# Patient Record
Sex: Female | Born: 1975 | Race: Black or African American | Hispanic: No | Marital: Single | Smoking: Current every day smoker
Health system: Southern US, Community
[De-identification: ages and names within clinical notes are randomized; demographics above are authoritative.]

## PROBLEM LIST (undated history)

## (undated) HISTORY — PX: WRIST SURGERY: SHX841

## (undated) HISTORY — PX: TUBAL LIGATION: SHX77

---

## 2003-02-28 ENCOUNTER — Emergency Department (HOSPITAL_COMMUNITY): Admission: EM | Admit: 2003-02-28 | Discharge: 2003-03-01 | Payer: Self-pay | Admitting: Emergency Medicine

## 2003-02-28 ENCOUNTER — Emergency Department (HOSPITAL_COMMUNITY): Admission: EM | Admit: 2003-02-28 | Discharge: 2003-02-28 | Payer: Self-pay | Admitting: Emergency Medicine

## 2003-03-09 ENCOUNTER — Emergency Department (HOSPITAL_COMMUNITY): Admission: EM | Admit: 2003-03-09 | Discharge: 2003-03-09 | Payer: Self-pay | Admitting: Internal Medicine

## 2004-01-14 ENCOUNTER — Emergency Department (HOSPITAL_COMMUNITY): Admission: EM | Admit: 2004-01-14 | Discharge: 2004-01-14 | Payer: Self-pay | Admitting: Emergency Medicine

## 2004-02-08 ENCOUNTER — Encounter: Admission: RE | Admit: 2004-02-08 | Discharge: 2004-02-28 | Payer: Self-pay | Admitting: Specialist

## 2013-08-10 ENCOUNTER — Encounter (HOSPITAL_COMMUNITY): Payer: Self-pay

## 2013-08-10 ENCOUNTER — Emergency Department (HOSPITAL_COMMUNITY)
Admission: EM | Admit: 2013-08-10 | Discharge: 2013-08-10 | Disposition: A | Discharge status: Home / Self Care | Payer: Medicare FFS | Attending: Emergency Medicine | Admitting: Emergency Medicine

## 2013-08-10 ENCOUNTER — Emergency Department (HOSPITAL_COMMUNITY): Payer: Medicare FFS

## 2013-08-10 DIAGNOSIS — J069 Acute upper respiratory infection, unspecified: Secondary | ICD-10-CM | POA: Insufficient documentation

## 2013-08-10 DIAGNOSIS — F17200 Nicotine dependence, unspecified, uncomplicated: Secondary | ICD-10-CM | POA: Insufficient documentation

## 2013-08-10 MED ORDER — BENZONATATE 100 MG PO CAPS
200.0000 mg | ORAL_CAPSULE | Freq: Once | ORAL | Status: AC
  Administered 2013-08-10: 200 mg via ORAL
  Filled 2013-08-10: qty 2

## 2013-08-10 MED ORDER — PROMETHAZINE-CODEINE 6.25-10 MG/5ML PO SYRP: 5.0000 mL | ORAL_SOLUTION | ORAL | Status: DC | PRN

## 2013-08-10 MED ORDER — BENZONATATE 200 MG PO CAPS: 200.0000 mg | ORAL_CAPSULE | Freq: Three times a day (TID) | ORAL | Status: DC

## 2013-08-10 NOTE — ED Notes (Signed)
Pt c/o nonproductive cough, chest congestion, and runny nose since Sunday.  Reports has been taking mucinex to help make cough productive.  Pt says has coughed so much her chest hurts with coughing.    Denies fever.

## 2013-08-10 NOTE — ED Notes (Signed)
Sick since Sunday with cough, sinus congestion and hurts in chest when coughs.

## 2013-08-12 NOTE — ED Provider Notes (Signed)
CSN: [id number]     Arrival date & time 08/10/13  1742 History   First MD Initiated Contact with Patient 08/10/13 1909     Chief Complaint  Patient presents with  . URI   (Consider location/radiation/quality/duration/timing/severity/associated sxs/prior Treatment) HPI Comments: Brenda Avila is a 37 y.o. female    presenting with a 2 day history of uri type symptoms which includes nasal congestion with clear rhinorrhea, sinus pressure and nonproductive cough with burning chest pain which is triggered by coughing.  Symptoms due to not include shortness of breath, hemoptysis, bloody nasal secretions, headache, ear pain, drainage or dizziness.  She denies nausea, vomiting or diarrhea.  The patient has taken mucinex prior to arrival with no significant improvement in symptoms.   The history is provided by the patient.    History reviewed. No pertinent past medical history. History reviewed. No pertinent past surgical history. No family history on file. History  Substance Use Topics  . Smoking status: Current Every Day Smoker  . Smokeless tobacco: Not on file  . Alcohol Use: No   OB History   Grav Para Term Preterm Abortions TAB SAB Ect Mult Living                 Review of Systems  Constitutional: Negative for fever and chills.  HENT: Positive for congestion, rhinorrhea and sinus pressure. Negative for sore throat and neck pain.   Eyes: Negative.   Respiratory: Positive for cough. Negative for chest tightness, shortness of breath, wheezing and stridor.   Cardiovascular: Negative for chest pain.  Gastrointestinal: Negative for nausea and abdominal pain.  Genitourinary: Negative.   Musculoskeletal: Negative for joint swelling and arthralgias.  Skin: Negative.  Negative for rash and wound.  Neurological: Negative for dizziness, weakness, light-headedness, numbness and headaches.  Psychiatric/Behavioral: Negative.     Allergies  Review of patient's allergies indicates no known  allergies.  Home Medications   Current Outpatient Rx  Name  Route  Sig  Dispense  Refill  . benzonatate (TESSALON) 200 MG capsule   Oral   Take 1 capsule (200 mg total) by mouth every 8 (eight) hours.   21 capsule   0   . promethazine-codeine (PHENERGAN WITH CODEINE) 6.25-10 MG/5ML syrup   Oral   Take 5 mLs by mouth every 4 (four) hours as needed for cough.   120 mL   0    BP 131/80  Pulse 92  Temp(Src) 99.8 F (37.7 C) (Oral)  Resp 18  Ht 5\' 8"  (1.727 m)  Wt 225 lb (102.059 kg)  BMI 34.22 kg/m2  SpO2 99%  LMP 08/10/2013 Physical Exam  Constitutional: She is oriented to person, place, and time. She appears well-developed and well-nourished.  HENT:  Head: Normocephalic and atraumatic.  Right Ear: Tympanic membrane, external ear and ear canal normal.  Left Ear: Tympanic membrane, external ear and ear canal normal.  Nose: Mucosal edema and rhinorrhea present.  Mouth/Throat: Uvula is midline, oropharynx is clear and moist and mucous membranes are normal. No oropharyngeal exudate, posterior oropharyngeal edema, posterior oropharyngeal erythema or tonsillar abscesses.  Sinus's nontender to palpation  Eyes: Conjunctivae are normal.  Cardiovascular: Normal rate and normal heart sounds.   Pulmonary/Chest: Effort normal. No respiratory distress. She has no decreased breath sounds. She has no wheezes. She has no rhonchi. She has no rales.  Abdominal: Soft. There is no tenderness.  Musculoskeletal: Normal range of motion.  Neurological: She is alert and oriented to person, place, and time.  Skin: Skin is warm and dry. No rash noted.  Psychiatric: She has a normal mood and affect.    ED Course  Procedures (including critical care time) Labs Review Labs Reviewed - No data to display Imaging Review Dg Chest 2 View  08/10/2013   *RADIOLOGY REPORT*  Clinical Data: Upper respiratory infection  CHEST - 2 VIEW  Comparison: None  Findings: The lungs are well-aerated and free from  pulmonary edema, focal airspace consolidation or pulmonary nodule.  Cardiac and mediastinal contours are within normal limits.  No pneumothorax, or pleural effusion. No acute osseous findings.  IMPRESSION:  No acute cardiopulmonary disease.   Original Report Authenticated By: Malachy Moan, M.D.    MDM   1. URI, acute    Encouraged to continue mucinex, increase fluid intake, motrin for chest pain relief,  Fever reduction (low grade here).  Prescribed tessalon for cough during day/working hours, phenergan with codeine for night time cough relief..  Encouraged rest,  Recheck for worsened sx.  Reviewed and discussed chest xray. No pulmonary bacterial infection at this time.  The patient appears reasonably screened and/or stabilized for discharge and I doubt any other medical condition or other [facility] requiring further screening, evaluation, or treatment in the ED at this time prior to discharge.     Burgess Amor, PA-C 08/12/13 1239

## 2013-08-13 NOTE — ED Provider Notes (Signed)
Medical screening examination/treatment/procedure(s) were performed by non-physician practitioner and as supervising physician I was immediately available for consultation/collaboration.   Dariyon Urquilla W. Kenzy Campoverde, MD 08/13/13 0841 

## 2016-05-17 ENCOUNTER — Emergency Department (HOSPITAL_COMMUNITY)
Admission: EM | Admit: 2016-05-17 | Discharge: 2016-05-18 | Disposition: A | Discharge status: Home / Self Care | Payer: Medicaid Other | Attending: Emergency Medicine | Admitting: Emergency Medicine

## 2016-05-17 ENCOUNTER — Encounter (HOSPITAL_COMMUNITY): Payer: Self-pay

## 2016-05-17 DIAGNOSIS — M79673 Pain in unspecified foot: Secondary | ICD-10-CM

## 2016-05-17 DIAGNOSIS — M79672 Pain in left foot: Secondary | ICD-10-CM | POA: Diagnosis not present

## 2016-05-17 DIAGNOSIS — F17200 Nicotine dependence, unspecified, uncomplicated: Secondary | ICD-10-CM | POA: Diagnosis not present

## 2016-05-17 NOTE — ED Notes (Signed)
Pt states she has had a hard callous on the bottom of her foot that has gotten worse since recently on her feet at her job, using otc meds for same without relief.

## 2016-05-18 ENCOUNTER — Emergency Department (HOSPITAL_COMMUNITY): Payer: Medicaid Other

## 2016-05-18 MED ORDER — IBUPROFEN 600 MG PO TABS: 600.0000 mg | ORAL_TABLET | Freq: Four times a day (QID) | ORAL | Status: DC | PRN

## 2016-05-18 MED ORDER — TRAMADOL HCL 50 MG PO TABS: ORAL_TABLET | ORAL | Status: DC

## 2016-05-18 NOTE — ED Provider Notes (Signed)
CSN: [id number]     Arrival date & time 05/17/16  2335 History   First MD Initiated Contact with Patient 05/18/16 0013     Chief Complaint  Patient presents with  . Foot Pain     (Consider location/radiation/quality/duration/timing/severity/associated sxs/prior Treatment) Patient is a 40 y.o. female presenting with lower extremity pain. The history is provided by the patient.  Foot Pain This is a new problem. The current episode started more than 1 year ago. Progression since onset: worse since Tuesday 6/20. Pertinent negatives include no fever, nausea, numbness or vomiting. The symptoms are aggravated by walking and standing. Treatments tried: OTC corn remover medication. The treatment provided no relief.    History reviewed. No pertinent past medical history. No past surgical history on file. No family history on file. Social History  Substance Use Topics  . Smoking status: Current Every Day Smoker  . Smokeless tobacco: None  . Alcohol Use: No   OB History    No data available     Review of Systems  Constitutional: Negative for fever.  Gastrointestinal: Negative for nausea and vomiting.  Musculoskeletal:       Foot pain  Neurological: Negative for numbness.  All other systems reviewed and are negative.     Allergies  Hydrocodone  Home Medications   Prior to Admission medications   Medication Sig Start Date End Date Taking? Authorizing Provider  benzonatate (TESSALON) 200 MG capsule Take 1 capsule (200 mg total) by mouth every 8 (eight) hours. 08/10/13   Burgess AmorJulie Idol, PA-C  promethazine-codeine (PHENERGAN WITH CODEINE) 6.25-10 MG/5ML syrup Take 5 mLs by mouth every 4 (four) hours as needed for cough. 08/10/13   Burgess AmorJulie Idol, PA-C   BP 133/94 mmHg  Pulse 84  Temp(Src) 98.2 F (36.8 C)  Resp 16  Ht 5\' 9"  (1.753 m)  Wt 94.348 kg  BMI 30.70 kg/m2  SpO2 93%  LMP 05/13/2016 Physical Exam  Constitutional: She is oriented to person, place, and time. She appears  well-developed and well-nourished.  Non-toxic appearance.  HENT:  Head: Normocephalic.  Right Ear: Tympanic membrane and external ear normal.  Left Ear: Tympanic membrane and external ear normal.  Eyes: EOM and lids are normal. Pupils are equal, round, and reactive to light.  Neck: Normal range of motion. Neck supple. Carotid bruit is not present.  Cardiovascular: Normal rate, regular rhythm, normal heart sounds, intact distal pulses and normal pulses.   Pulmonary/Chest: Breath sounds normal. No respiratory distress.  Abdominal: Soft. Bowel sounds are normal. There is no tenderness. There is no guarding.  Musculoskeletal: Normal range of motion.  Lymphadenopathy:       Head (right side): No submandibular adenopathy present.       Head (left side): No submandibular adenopathy present.    She has no cervical adenopathy.  Neurological: She is alert and oriented to person, place, and time. She has normal strength. No cranial nerve deficit or sensory deficit.  Skin: Skin is warm and dry.  Psychiatric: She has a normal mood and affect. Her speech is normal.  Nursing note and vitals reviewed.   ED Course  Procedures (including critical care time) Labs Review Labs Reviewed - No data to display  Imaging Review No results found. I have personally reviewed and evaluated these images and lab results as part of my medical decision-making.   EKG Interpretation None      MDM  Vital signs stable. Xray is negative for f/b or fracture.  Pt referred to podiatry. Pt  will use warm tub soaks and Vaseline to the affected area. Rx for ultram and ibuprofen given to the patient. Pt in agreement with plan.   Final diagnoses:  Foot pain, unspecified laterality    *I have reviewed nursing notes, vital signs, and all appropriate lab and imaging results for this patient.**    Ivery QualeHobson Anjannette Gauger, PA-C 05/18/16 1836  Ivery QualeHobson Edmundo Tedesco, PA-C 05/23/16 [id number]  Devoria AlbeIva Knapp, MD 05/23/16 2259

## 2016-05-18 NOTE — Discharge Instructions (Signed)
Soak your foot in warm epson salt water daily. See Dr Al CorpusHyatt or a member of his team as soon as possible. Use ibuprofen  Every 6 hours for mild pain. Use ultram for more severe pain. This medication may cause drowsiness. Please do not drink, drive, or participate in activity that requires concentration while taking this medication.

## 2016-05-22 ENCOUNTER — Ambulatory Visit (INDEPENDENT_AMBULATORY_CARE_PROVIDER_SITE_OTHER): Payer: Medicaid Other | Admitting: Podiatry

## 2016-05-22 ENCOUNTER — Encounter: Payer: Self-pay | Admitting: Podiatry

## 2016-05-22 VITALS — BP 139/83 | HR 82 | Resp 16

## 2016-05-22 DIAGNOSIS — B078 Other viral warts: Secondary | ICD-10-CM

## 2016-05-22 DIAGNOSIS — B079 Viral wart, unspecified: Secondary | ICD-10-CM

## 2016-05-22 DIAGNOSIS — L988 Other specified disorders of the skin and subcutaneous tissue: Secondary | ICD-10-CM | POA: Diagnosis not present

## 2016-05-22 DIAGNOSIS — L989 Disorder of the skin and subcutaneous tissue, unspecified: Secondary | ICD-10-CM

## 2016-05-22 NOTE — Progress Notes (Addendum)
   Subjective:    Patient ID: Brenda Avila, female    DOB: 10-Jun-1976, 40 y.o.   MRN: [id number]  HPI this patient presents to the office with chief complaint of a painful area on the bottom of her left foot. She says her pain has been present for about a year. She says this started when she started wearing her sandals 1 year ago approximately November of last year. She realized she had a skin lesion on the bottom of her left foot and started treating the skin lesion with acid treatments. She has continued using the acid in the problem continues. She is now having pain and discomfort walking and wearing her shoes. She presents the office today for definitive evaluation and treatment of this condition    Review of Systems  All other systems reviewed and are negative.      Objective:   Physical Exam GENERAL APPEARANCE: Alert, conversant. Appropriately groomed. No acute distress.  VASCULAR: Pedal pulses are  [facility]  [facility].  Capillary refill time is immediate to all digits,  Normal temperature gradient.  Digital hair growth is present bilateral  NEUROLOGIC: sensation is normal to 5.07 monofilament at 5/5 sites bilateral.  Light touch is intact bilateral, Muscle strength normal.  MUSCULOSKELETAL: acceptable muscle strength, tone and stability bilateral.  Intrinsic muscluature intact bilateral.  Rectus appearance of foot and digits noted bilateral.   DERMATOLOGIC: skin color, texture, and turgor are within normal limits.  No preulcerative lesions or ulcers  are seen, no interdigital maceration noted.  No open lesions present.   No drainage noted. Clear skin lesion sub 5th metabase left foot.  Significant necrotic tissue noted at this site due to acid treatment         Assessment & Plan:  Benign skin lesion  Verrucae  IE  Debride skin lesion.  Discussed future treatment and possible excision of this lesion.  Discontinue acid usage. RTC prn  Helane GuntherGregory Mayer DPM

## 2016-06-27 ENCOUNTER — Ambulatory Visit (INDEPENDENT_AMBULATORY_CARE_PROVIDER_SITE_OTHER): Payer: Medicaid Other | Admitting: Podiatry

## 2016-06-27 DIAGNOSIS — Q828 Other specified congenital malformations of skin: Secondary | ICD-10-CM

## 2016-06-27 DIAGNOSIS — L989 Disorder of the skin and subcutaneous tissue, unspecified: Secondary | ICD-10-CM

## 2016-06-27 NOTE — Progress Notes (Signed)
This patient presents to the office for the painful skin lesion that has returned on the bottom of her left foot. She states that the pain has become significant since he started working at her new job. She states that her job requires her to stand and walk which causes pain and discomfort through the site. She self treated herself with acid approximately 6 weeks ago and came to the office for treatment. The necrotic tissue from the acid application was to provided and the patient was told to return to the office if the problem returned. She now returns to the office for definitive evaluation and treatment of this condition  GENERAL APPEARANCE: Alert, conversant. Appropriately groomed. No acute distress.  VASCULAR: Pedal pulses are  palpable at  [facility] and PT bilateral.  Capillary refill time is immediate to all digits,  Normal temperature gradient.  Digital hair growth is present bilateral  NEUROLOGIC: sensation is normal to 5.07 monofilament at 5/5 sites bilateral.  Light touch is intact bilateral, Muscle strength normal.  MUSCULOSKELETAL: acceptable muscle strength, tone and stability bilateral.  Intrinsic muscluature intact bilateral.  Rectus appearance of foot and digits noted bilateral.   DERMATOLOGIC: skin color, texture, and turgor are within normal limits.  No preulcerative lesions or ulcers  are seen, no interdigital maceration noted.  No open lesions present.  Digital nails are asymptomatic. No drainage noted. Porokeratotic tissue noted sub 5th metabase.  Benign skin lesion.  Porokeratosis left foot.  ROV  Debride porokeratosis.  Padding dispensed  Discussed future acid and possible surgical curretage.  We discussed postponing definitive treatment since she just started her new job.  To reschedule when her schedule has a few days off.   Helane Gunther DPM

## 2016-10-02 ENCOUNTER — Encounter: Payer: Self-pay | Admitting: Podiatry

## 2016-10-02 ENCOUNTER — Ambulatory Visit (INDEPENDENT_AMBULATORY_CARE_PROVIDER_SITE_OTHER): Payer: Medicaid Other | Admitting: Podiatry

## 2016-10-02 DIAGNOSIS — Q828 Other specified congenital malformations of skin: Secondary | ICD-10-CM

## 2016-10-02 DIAGNOSIS — L989 Disorder of the skin and subcutaneous tissue, unspecified: Secondary | ICD-10-CM

## 2016-10-02 NOTE — Progress Notes (Signed)
This patient presents to the office for the painful skin lesion that has returned on the bottom of her left foot. She states that the pain has become significant since he started working at her new job. She states that her job requires her to stand and walk which causes pain and discomfort through the site. She self treated herself with acid approximately 6 weeks ago and came to the office for treatment. The necrotic tissue from the acid application was to provided and the patient was told to return to the office if the problem returned. She now returns to the office for definitive evaluation and treatment of this condition  GENERAL APPEARANCE: Alert, conversant. Appropriately groomed. No acute distress.  VASCULAR: Pedal pulses are  [facility]pable [facility]  [facility].  Capillary refill time is immediate to all digits,  Normal temperature gradient.  Digital hair growth is present bilateral  NEUROLOGIC: sensation is normal to 5.07 monofilament at 5/5 sites bilateral.  Light touch is intact bilateral, Muscle strength normal.  MUSCULOSKELETAL: acceptable muscle strength, tone and stability bilateral.  Intrinsic muscluature intact bilateral.  Rectus appearance of foot and digits noted bilateral.   DERMATOLOGIC: skin color, texture, and turgor are within normal limits.  No preulcerative lesions or ulcers  are seen, no interdigital maceration noted.  No open lesions present.  Digital nails are asymptomatic. No drainage noted. Porokeratotic tissue noted sub 5th metabase.  Benign skin lesion.  Porokeratosis left foot.  ROV  Debride porokeratosis.  Padding dispensed  Discussed future acid and possible surgical curretage.  We discussed postponing definitive treatment since she just started her new job.  We discussed surgery but she started to sweat and cry and be nervous. Therefore we postponed the surgery until a later date.   Helane GuntherGregory Livingston Antolin DPM

## 2016-10-23 ENCOUNTER — Encounter: Payer: Self-pay | Admitting: Podiatry

## 2016-10-23 ENCOUNTER — Other Ambulatory Visit: Payer: Self-pay | Admitting: Podiatry

## 2016-10-23 ENCOUNTER — Ambulatory Visit (INDEPENDENT_AMBULATORY_CARE_PROVIDER_SITE_OTHER): Payer: Medicaid Other | Admitting: Podiatry

## 2016-10-23 VITALS — BP 158/106 | HR 92 | Resp 18

## 2016-10-23 DIAGNOSIS — L988 Other specified disorders of the skin and subcutaneous tissue: Secondary | ICD-10-CM

## 2016-10-23 DIAGNOSIS — Q828 Other specified congenital malformations of skin: Secondary | ICD-10-CM | POA: Diagnosis not present

## 2016-10-23 DIAGNOSIS — L989 Disorder of the skin and subcutaneous tissue, unspecified: Secondary | ICD-10-CM

## 2016-10-23 NOTE — Progress Notes (Signed)
This patient presents to the office for the painful skin lesion that has returned on the bottom of her left foot. She states that the pain has become significant since he started working at her new job. She states that her job requires her to stand and walk which causes pain and discomfort through the site. She presents to the office for scheduled removal of the skin lesion on the bottom of the left foot  GENERAL APPEARANCE: Alert, conversant. Appropriately groomed. No acute distress.  VASCULAR: Pedal pulses are  palpable at  [facility].  Capillary refill time is immediate to all digits,  Normal temperature gradient.  Digital hair growth is present bilateral  NEUROLOGIC: sensation is normal to 5.07 monofilament at 5/5 sites bilateral.  Light touch is intact bilateral, Muscle strength normal.  MUSCULOSKELETAL: acceptable muscle strength, tone and stability bilateral.  Intrinsic muscluature intact bilateral.  Rectus appearance of foot and digits noted bilateral.   DERMATOLOGIC: skin color, texture, and turgor are within normal limits.  No preulcerative lesions or ulcers  are seen, no interdigital maceration noted.  No open lesions present.  Digital nails are asymptomatic. No drainage noted. Porokeratotic tissue noted sub 5th metabase.  Benign skin lesion.  Porokeratosis left foot.  ROV   Skin surgery.  This patient was anesthetized with mixture of 2% lidocaine plain and 2 % lidocaine with epi. at the site of the skin lesion.  The surgical site was then washed with betadine and alcohol.  Using a punch the lesion was excised and passed off as specimen.  The surgical site was cauterized with phenol and washed with alcohol.  The site was bandaged with neosporin, sterile 2x2 and kling.  Home instructions given.   Helane GuntherGregory Zerick Prevette DPM   Helane GuntherGregory Latunya Kissick DPM

## 2016-10-25 ENCOUNTER — Telehealth: Payer: Self-pay | Admitting: *Deleted

## 2016-10-25 DIAGNOSIS — L989 Disorder of the skin and subcutaneous tissue, unspecified: Secondary | ICD-10-CM

## 2016-10-25 NOTE — Telephone Encounter (Signed)
[facility]23/2016 [facility]

## 2016-10-28 LAB — PATHOLOGY

## 2016-10-30 ENCOUNTER — Encounter: Payer: Self-pay | Admitting: Podiatry

## 2016-10-30 ENCOUNTER — Ambulatory Visit (INDEPENDENT_AMBULATORY_CARE_PROVIDER_SITE_OTHER): Payer: Medicaid Other | Admitting: Podiatry

## 2016-10-30 VITALS — Resp 16 | Ht 69.0 in

## 2016-10-30 DIAGNOSIS — Z09 Encounter for follow-up examination after completed treatment for conditions other than malignant neoplasm: Secondary | ICD-10-CM

## 2016-10-30 NOTE — Progress Notes (Signed)
This patient presents the office follow-up for the removal of the porokeratosis on the plantar aspect of the left foot. She states she is not having much pain or discomfort, but there is a little bleeding from the surgical site. She has been soaking and bandaging her foot as requested. He presents the office today for continued evaluation and treatment of this condition  GENERAL APPEARANCE: Alert, conversant. Appropriately groomed. No acute distress.  VASCULAR: Pedal pulses are  palpable at  [facility]  Capillary refill time is immediate to all digits,  Normal temperature gradient.  Digital hair growth is present bilateral  NEUROLOGIC: sensation is normal to 5.07 monofilament at 5/5 sites bilateral.  Light touch is intact bilateral, Muscle strength normal.  MUSCULOSKELETAL: acceptable muscle strength, tone and stability bilateral.  Intrinsic muscluature intact bilateral.  Rectus appearance of foot and digits noted bilateral.   DERMATOLOGIC: skin color, texture, and turgor are within normal limits.  No preulcerative lesions or ulcers  are seen, no interdigital maceration noted.  No open lesions present.  Digital nails are asymptomatic. No drainage noted. Healing noted site of porokeratosis left foot.  No redness or swelling noted.  S/p foot surgery   ROV  Neosporin/DSD  Continue soaks and bandage throught the week.  RTC prn   Helane GuntherGregory Dalary Hollar DPM

## 2017-02-28 ENCOUNTER — Encounter (HOSPITAL_COMMUNITY): Payer: Self-pay | Admitting: *Deleted

## 2017-02-28 ENCOUNTER — Emergency Department (HOSPITAL_COMMUNITY)
Admission: EM | Admit: 2017-02-28 | Discharge: 2017-02-28 | Disposition: A | Discharge status: Home / Self Care | Payer: Medicaid Other | Attending: Emergency Medicine | Admitting: Emergency Medicine

## 2017-02-28 ENCOUNTER — Emergency Department (HOSPITAL_COMMUNITY): Payer: Medicaid Other

## 2017-02-28 DIAGNOSIS — F17200 Nicotine dependence, unspecified, uncomplicated: Secondary | ICD-10-CM | POA: Insufficient documentation

## 2017-02-28 DIAGNOSIS — R0789 Other chest pain: Secondary | ICD-10-CM | POA: Diagnosis present

## 2017-02-28 DIAGNOSIS — R8299 Other abnormal findings in urine: Secondary | ICD-10-CM | POA: Insufficient documentation

## 2017-02-28 DIAGNOSIS — R079 Chest pain, unspecified: Secondary | ICD-10-CM

## 2017-02-28 DIAGNOSIS — R05 Cough: Secondary | ICD-10-CM | POA: Diagnosis not present

## 2017-02-28 DIAGNOSIS — R0602 Shortness of breath: Secondary | ICD-10-CM | POA: Diagnosis not present

## 2017-02-28 LAB — CBC WITH DIFFERENTIAL/PLATELET
BASOS ABS: 0 10*3/uL (ref 0.0–0.1)
Basophils Relative: 1 %
Eosinophils Absolute: 0.3 10*3/uL (ref 0.0–0.7)
Eosinophils Relative: 4 %
HCT: 39.3 % (ref 36.0–46.0)
Hemoglobin: 13.5 g/dL (ref 12.0–15.0)
LYMPHS PCT: 52 %
Lymphs Abs: 3.5 10*3/uL (ref 0.7–4.0)
MCH: 31.4 pg (ref 26.0–34.0)
MCHC: 34.4 g/dL (ref 30.0–36.0)
MCV: 91.4 fL (ref 78.0–100.0)
Monocytes Absolute: 0.4 10*3/uL (ref 0.1–1.0)
Monocytes Relative: 5 %
NEUTROS ABS: 2.6 10*3/uL (ref 1.7–7.7)
Neutrophils Relative %: 38 %
Platelets: 300 10*3/uL (ref 150–400)
RBC: 4.3 MIL/uL (ref 3.87–5.11)
RDW: 15.3 % (ref 11.5–15.5)
WBC: 6.7 10*3/uL (ref 4.0–10.5)

## 2017-02-28 LAB — URINALYSIS, ROUTINE W REFLEX MICROSCOPIC
Bilirubin Urine: NEGATIVE
Glucose, UA: NEGATIVE mg/dL
KETONES UR: NEGATIVE mg/dL
Nitrite: NEGATIVE
PROTEIN: NEGATIVE mg/dL
Specific Gravity, Urine: 1.024 (ref 1.005–1.030)
pH: 5 (ref 5.0–8.0)

## 2017-02-28 LAB — COMPREHENSIVE METABOLIC PANEL
ALT: 20 U/L (ref 14–54)
AST: 24 U/L (ref 15–41)
Albumin: 3.6 g/dL (ref 3.5–5.0)
Alkaline Phosphatase: 41 U/L (ref 38–126)
Anion gap: 8 (ref 5–15)
BUN: 8 mg/dL (ref 6–20)
CO2: 24 mmol/L (ref 22–32)
Calcium: 8.9 mg/dL (ref 8.9–10.3)
Chloride: 105 mmol/L (ref 101–111)
Creatinine, Ser: 1.08 mg/dL — ABNORMAL HIGH (ref 0.44–1.00)
Glucose, Bld: 95 mg/dL (ref 65–99)
POTASSIUM: 3.9 mmol/L (ref 3.5–5.1)
Sodium: 137 mmol/L (ref 135–145)
Total Bilirubin: 0.3 mg/dL (ref 0.3–1.2)
Total Protein: 7 g/dL (ref 6.5–8.1)

## 2017-02-28 LAB — POC URINE PREG, ED: PREG TEST UR: NEGATIVE

## 2017-02-28 LAB — LIPASE, BLOOD: Lipase: 25 U/L (ref 11–51)

## 2017-02-28 LAB — TROPONIN I: Troponin I: <0.03 ng/mL (ref <0.03)

## 2017-02-28 MED ORDER — PREDNISONE 20 MG PO TABS: ORAL_TABLET | ORAL | 0 refills | Status: DC

## 2017-02-28 MED ORDER — MELOXICAM 7.5 MG PO TABS: 7.5000 mg | ORAL_TABLET | Freq: Every day | ORAL | 0 refills | Status: DC

## 2017-02-28 MED ORDER — OXYCODONE-ACETAMINOPHEN 5-325 MG PO TABS
1.0000 | ORAL_TABLET | Freq: Once | ORAL | Status: DC
  Filled 2017-02-28: qty 1

## 2017-02-28 MED ORDER — PREDNISONE 20 MG PO TABS
40.0000 mg | ORAL_TABLET | Freq: Once | ORAL | Status: AC
  Administered 2017-02-28: 40 mg via ORAL
  Filled 2017-02-28: qty 2

## 2017-02-28 MED ORDER — ALBUTEROL SULFATE HFA 108 (90 BASE) MCG/ACT IN AERS
4.0000 | INHALATION_SPRAY | Freq: Once | RESPIRATORY_TRACT | Status: AC
  Administered 2017-02-28: 4 via RESPIRATORY_TRACT
  Filled 2017-02-28: qty 6.7

## 2017-02-28 MED ORDER — KETOROLAC TROMETHAMINE 60 MG/2ML IM SOLN
60.0000 mg | Freq: Once | INTRAMUSCULAR | Status: DC
  Filled 2017-02-28: qty 2

## 2017-02-28 NOTE — ED Provider Notes (Signed)
[facility]-EMERGENCY DEPT Provider Note   CSN: [id number] Arrival date & time: 02/28/17  2028   By signing my name below, I, Brenda Avila, attest that this documentation has been prepared under the direction and in the presence of Brenda Memos, MD. Electronically Signed: Bobbie Avila, Scribe. 02/28/17. 9:33 PM. History   Chief Complaint Chief Complaint  Patient presents with  . Chest Pain    The history is provided by the patient. No language interpreter was used.  HPI Comments: Brenda Avila is a 41 y.o. female who presents to the Emergency Department complaining of lower central intermittent chest pain that radiates into her back for the past 2 weeks. She describes the pain as "sharp". She states that 2 days ago the pain began to worsen. Pain lasts for around 5 to 6 minutes. The pain worsens with exertion and bending over. She also reports some SOB when she bends over and productive cough with clear phlegm recently. She states that she has not been eating much recently because she has been very busy. She denies any known cardiac issues. She denies hx of blood clots. She also denies any recent travel history. She is a current smoker. She denies color change, fever, nausea, and vomiting.  History reviewed. No pertinent past medical history.  There are no active problems to display for this patient.   Past Surgical History:  Procedure Laterality Date  . CESAREAN SECTION      OB History    No data available       Home Medications    Prior to Admission medications   Medication Sig Start Date End Date Taking? Authorizing Provider  meloxicam (MOBIC) 7.5 MG tablet Take 1 tablet (7.5 mg total) by mouth daily. 02/28/17   Brenda Memos, MD  predniSONE (DELTASONE) 20 MG tablet 2 tabs po daily x 4 days 02/28/17   Brenda Memos, MD    Family History History reviewed. No pertinent family history.  Social History Social History  Substance Use Topics  . Smoking status: Current Every Day  Smoker  . Smokeless tobacco: Never Used  . Alcohol use No     Allergies   Hydrocodone and Tramadol   Review of Systems Review of Systems  Constitutional: Negative for fever.  Respiratory: Positive for cough and shortness of breath.   Cardiovascular: Positive for chest pain.  Gastrointestinal: Negative for abdominal pain, nausea and vomiting.  Musculoskeletal: Negative for back pain.  Skin: Negative for color change.  All other systems reviewed and are negative.   Physical Exam Updated Vital Signs BP 107/85   Pulse 64   Temp 98.7 F (37.1 C) (Oral)   Resp 17   Ht  (1.753 m)   Wt 216 lb (98 kg)   LMP 02/23/2017   SpO2 98%   BMI 31.90 kg/m   Physical Exam  Constitutional: She is oriented to person, place, and time. She appears well-developed and well-nourished.  HENT:  Head: Normocephalic.  Eyes: EOM are normal.  Neck: Normal range of motion.  Cardiovascular: Normal rate and regular rhythm.   Heart sounds normal.  Pulmonary/Chest: Effort normal. She has no wheezes. She exhibits tenderness.  Decreased breath sounds bilaterally. No wheezing. Tenderness over xiphoid process.  Abdominal: She exhibits no distension.  Musculoskeletal: Normal range of motion.  Neurological: She is alert and oriented to person, place, and time.  Psychiatric: She has a normal mood and affect.  Nursing note and vitals reviewed.    ED Treatments / Results  DIAGNOSTIC  STUDIES: Oxygen Saturation is 99% on RA, normal by my interpretation.    COORDINATION OF CARE: 9:23 PM Discussed treatment plan with pt at bedside and pt agreed to plan. I will do a full cardiac work-up for the patient.  Labs (all labs ordered are listed, but only abnormal results are displayed) Labs Reviewed  COMPREHENSIVE METABOLIC PANEL - Abnormal; Notable for the following:       Result Value   Creatinine, Ser 1.08 (*)    All other components within normal limits  URINALYSIS, ROUTINE W REFLEX MICROSCOPIC -  Abnormal; Notable for the following:    APPearance HAZY (*)    Hgb urine dipstick SMALL (*)    Leukocytes, UA LARGE (*)    Bacteria, UA RARE (*)    Squamous Epithelial / LPF 0-5 (*)    All other components within normal limits  URINE CULTURE  TROPONIN I  LIPASE, BLOOD  CBC WITH DIFFERENTIAL/PLATELET  POC URINE PREG, ED    EKG  EKG Interpretation  Date/Time:  Friday February 28 2017 20:51:04 EDT Ventricular Rate:  82 PR Interval:    QRS Duration: 84 QT Interval:  379 QTC Calculation: 443 R Axis:   59 Text Interpretation:  Sinus rhythm No old tracing to compare Confirmed by [facility] [address] MD, Barbara Cower [phone]) on 02/28/2017 9:18:12 PM       Radiology Dg Chest 2 View  Result Date: 02/28/2017 CLINICAL DATA:  Midsternal chest pain for 2 days with dyspnea and fatigue EXAM: CHEST  2 VIEW COMPARISON:  07/31/2013 FINDINGS: The lungs are clear. The pulmonary vasculature is normal. Heart size is normal. Hilar and mediastinal contours are unremarkable. There is no pleural effusion. IMPRESSION: No active cardiopulmonary disease. Electronically Signed   By: Ellery Plunk M.D.   On: 02/28/2017 22:23    Procedures Procedures (including critical care time)  Medications Ordered in ED Medications  ketorolac (TORADOL) injection 60 mg (60 mg Intramuscular Not Given 02/28/17 2156)  oxyCODONE-acetaminophen (PERCOCET/ROXICET) 5-325 MG per tablet 1 tablet (1 tablet Oral Not Given 02/28/17 2156)  albuterol (PROVENTIL HFA;VENTOLIN HFA) 108 (90 Base) MCG/ACT inhaler 4 puff (4 puffs Inhalation Given 02/28/17 2154)  predniSONE (DELTASONE) tablet 40 mg (40 mg Oral Given 02/28/17 2154)     Initial Impression / Assessment and Plan / ED Course  I have reviewed the triage vital signs and the nursing notes.  Pertinent labs & imaging results that were available during my care of the patient were reviewed by me and considered in my medical decision making (see chart for details).     Suspect bronchitis and  costochondritis. - will dc on NSAIDs, steroids and inhaler.  PERC negative, doubt PE.  Atypical for ACS.  No rash to suggest zoster/cellulitis.  Workup negative otherwise making PTX, pneumonia, dissection unlikely.   Final Clinical Impressions(s) / ED Diagnoses   Final diagnoses:  Chest pain, unspecified type    New Prescriptions Discharge Medication List as of 02/28/2017 11:14 PM    START taking these medications   Details  meloxicam (MOBIC) 7.5 MG tablet Take 1 tablet (7.5 mg total) by mouth daily., Starting Fri 02/28/2017, Print    predniSONE (DELTASONE) 20 MG tablet 2 tabs po daily x 4 days, Print       I personally performed the services described in this documentation, which was scribed in my presence. The recorded information has been reviewed and is accurate.   Brenda Memos, MD 02/28/17 [phone]

## 2017-02-28 NOTE — ED Notes (Signed)
Pt states that she has been having chest pain on and off for about 2 weeks it has been getting worse it has started going to her back neck and left arm. Pt denies flu like s/s. Nothing helps it she is having some sob with this it is worse when she is lying down

## 2017-02-28 NOTE — ED Triage Notes (Signed)
Pt reports chest pain that radiates down in her left arm and into her back. Pt reports sob when she bends over. All x 2 weeks. Pt states the pain is getting increasingly worse.

## 2017-03-02 LAB — URINE CULTURE: CULTURE: NO GROWTH

## 2019-01-15 ENCOUNTER — Encounter (HOSPITAL_COMMUNITY): Payer: Self-pay | Admitting: Emergency Medicine

## 2019-01-15 ENCOUNTER — Other Ambulatory Visit: Payer: Self-pay

## 2019-01-15 ENCOUNTER — Emergency Department (HOSPITAL_COMMUNITY)
Admission: EM | Admit: 2019-01-15 | Discharge: 2019-01-15 | Disposition: A | Discharge status: Home / Self Care | Payer: BLUE CROSS/BLUE SHIELD | Attending: Emergency Medicine | Admitting: Emergency Medicine

## 2019-01-15 DIAGNOSIS — F17210 Nicotine dependence, cigarettes, uncomplicated: Secondary | ICD-10-CM | POA: Diagnosis not present

## 2019-01-15 DIAGNOSIS — K439 Ventral hernia without obstruction or gangrene: Secondary | ICD-10-CM | POA: Diagnosis not present

## 2019-01-15 DIAGNOSIS — K469 Unspecified abdominal hernia without obstruction or gangrene: Secondary | ICD-10-CM

## 2019-01-15 DIAGNOSIS — R1033 Periumbilical pain: Secondary | ICD-10-CM | POA: Diagnosis present

## 2019-01-15 NOTE — ED Provider Notes (Signed)
[facility] EMERGENCY DEPARTMENT Provider Note   CSN: [id number] Arrival date & time: 01/15/19  0041    History   Chief Complaint Chief Complaint  Patient presents with  . Abdominal Pain    HPI Brenda Avila is a 43 y.o. female.     The history is provided by the patient.  Abdominal Pain  Pain location:  Periumbilical Pain quality: aching and sharp   Pain radiates to:  Does not radiate Pain severity:  Mild Onset quality:  Gradual Duration:  5 hours Timing:  Constant Progression:  Resolved Chronicity:  Recurrent Relieved by:  None tried Worsened by:  Nothing Ineffective treatments:  None tried Associated symptoms: no constipation, no nausea and no vomiting     History reviewed. No pertinent past medical history.  There are no active problems to display for this patient.   Past Surgical History:  Procedure Laterality Date  . CESAREAN SECTION    . WRIST SURGERY       OB History   No obstetric history on file.      Home Medications    Prior to Admission medications   Medication Sig Start Date End Date Taking? Authorizing Provider  meloxicam (MOBIC) 7.5 MG tablet Take 1 tablet (7.5 mg total) by mouth daily. 02/28/17   Bronislaus Verdell, Barbara Cower, MD  predniSONE (DELTASONE) 20 MG tablet 2 tabs po daily x 4 days 02/28/17   Danene Montijo, Barbara Cower, MD    Family History History reviewed. No pertinent family history.  Social History Social History   Tobacco Use  . Smoking status: Current Every Day Smoker  . Smokeless tobacco: Never Used  Substance Use Topics  . Alcohol use: No  . Drug use: No     Allergies   Hydrocodone and Tramadol   Review of Systems Review of Systems  Gastrointestinal: Positive for abdominal pain. Negative for blood in stool, constipation, nausea and vomiting.  All other systems reviewed and are negative.    Physical Exam Updated Vital Signs BP 134/69 (BP Location: Left Arm)   Pulse 68   Temp 98.2 F (36.8 C) (Oral)   Resp 16   Ht 5\' 9"   (1.753 m)   Wt 90.7 kg   LMP 01/04/2019   SpO2 100%   BMI 29.53 kg/m   Physical Exam Vitals signs and nursing note reviewed.  Constitutional:      Appearance: She is well-developed.  HENT:     Head: Normocephalic and atraumatic.     Mouth/Throat:     Mouth: Mucous membranes are moist.  Eyes:     Extraocular Movements: Extraocular movements intact.     Conjunctiva/sclera: Conjunctivae normal.     Pupils: Pupils are equal, round, and reactive to light.  Neck:     Musculoskeletal: Normal range of motion.  Cardiovascular:     Rate and Rhythm: Normal rate and regular rhythm.  Pulmonary:     Effort: Pulmonary effort is normal. No respiratory distress.     Breath sounds: No stridor.  Abdominal:     General: Bowel sounds are normal. There is no distension.     Hernia: A hernia (small ventral) is present.  Musculoskeletal: Normal range of motion.        General: No swelling or tenderness.  Skin:    General: Skin is warm and dry.  Neurological:     General: No focal deficit present.     Mental Status: She is alert.      ED Treatments / Results  Labs (all  labs ordered are listed, but only abnormal results are displayed) Labs Reviewed - No data to display  EKG None  Radiology No results found.  Procedures Procedures (including critical care time)  Medications Ordered in ED Medications - No data to display   Initial Impression / Assessment and Plan / ED Course  I have reviewed the triage vital signs and the nursing notes.  Pertinent labs & imaging results that were available during my care of the patient were reviewed by me and considered in my medical decision making (see chart for details).        Hernia spontaneously reduced prior to arrival. Improved symptoms prior to my evaluation. Doubt incarceration and no e/o bowel obstruction. No indication for labs/imaging or further workup. Will fu w/ general surgery for elective options.   Final Clinical  Impressions(s) / ED Diagnoses   Final diagnoses:  Periumbilical abdominal pain  Hernia of abdominal cavity    ED Discharge Orders    None       Tamyah Cutbirth, Barbara Cower, MD 01/15/19 0222

## 2019-01-15 NOTE — ED Triage Notes (Signed)
Pt c/o abd pain and states it is her hernia acting up. She states she has had hernia 17 years.

## 2019-01-21 ENCOUNTER — Ambulatory Visit (INDEPENDENT_AMBULATORY_CARE_PROVIDER_SITE_OTHER): Payer: BLUE CROSS/BLUE SHIELD | Admitting: General Surgery

## 2019-01-21 ENCOUNTER — Encounter: Payer: Self-pay | Admitting: General Surgery

## 2019-01-21 VITALS — BP 118/76 | HR 84 | Temp 97.3°F | Resp 18 | Wt 214.0 lb

## 2019-01-21 DIAGNOSIS — K432 Incisional hernia without obstruction or gangrene: Secondary | ICD-10-CM | POA: Diagnosis not present

## 2019-01-21 NOTE — Patient Instructions (Signed)
Ventral Hernia    A ventral hernia is a bulge of tissue from inside the abdomen that pushes through a weak area of the muscles that form the front wall of the abdomen. The tissues inside the abdomen are inside a sac (peritoneum). These tissues include the small intestine, large intestine, and the fatty tissue that covers the intestines (omentum). Sometimes, the bulge that forms a hernia contains intestines. Other hernias contain only fat. Ventral hernias do not go away without surgical treatment.  There are several types of ventral hernias. You may have:   A hernia at an incision site from previous abdominal surgery (incisional hernia).   A hernia just above the belly button (epigastric hernia), or at the belly button (umbilical hernia). These types of hernias can develop from heavy lifting or straining.   A hernia that comes and goes (reducible hernia). It may be visible only when you lift or strain. This type of hernia can be pushed back into the abdomen (reduced).   A hernia that traps abdominal tissue inside the hernia (incarcerated hernia). This type of hernia does not reduce.   A hernia that cuts off blood flow to the tissues inside the hernia (strangulated hernia). The tissues can start to die if this happens. This is a very painful bulge that cannot be reduced. A strangulated hernia is a medical emergency.  What are the causes?  This condition is caused by abdominal tissue putting pressure on an area of weakness in the abdominal muscles.  What increases the risk?  The following factors may make you more likely to develop this condition:   Being female.   Being 60 or older.   Being overweight or obese.   Having had previous abdominal surgery, especially if there was an infection after surgery.   Having had an injury to the abdominal wall.   Having had several pregnancies.   Having a buildup of fluid inside the abdomen (ascites).  What are the signs or symptoms?  The only symptom of a ventral hernia  may be a painless bulge in the abdomen. A reducible hernia may be visible only when you strain, cough, or lift. Other symptoms may include:   Dull pain.   A feeling of pressure.  Signs and symptoms of a strangulated hernia may include:   Increasing pain.   Nausea and vomiting.   Pain when pressing on the hernia.   The skin over the hernia turning red or purple.   Constipation.   Blood in the stool (feces).  How is this diagnosed?  This condition may be diagnosed based on:   Your symptoms.   Your medical history.   A physical exam. You may be asked to cough or strain while standing. These actions increase the pressure inside your abdomen and force the hernia through the opening in your muscles. Your health care provider may try to reduce the hernia by pressing on it.   Imaging studies, such as an ultrasound or CT scan.  How is this treated?  This condition is treated with surgery. If you have a strangulated hernia, surgery is done as soon as possible. If your hernia is small and not incarcerated, you may be asked to lose some weight before surgery.  Follow these instructions at home:   Follow instructions from your health care provider about eating or drinking restrictions.   If you are overweight, your health care provider may recommend that you increase your activity level and eat a healthier diet.     Do not lift anything that is heavier than 10 lb (4.5 kg).   Return to your normal activities as told by your health care provider. Ask your health care provider what activities are safe for you. You may need to avoid activities that increase pressure on your hernia.   Take over-the-counter and prescription medicines only as told by your health care provider.   Keep all follow-up visits as told by your health care provider. This is important.  Contact a health care provider if:   Your hernia gets larger.   Your hernia becomes painful.  Get help right away if:   Your hernia becomes increasingly  painful.   You have pain along with any of the following:  ? Changes in skin color in the area of the hernia.  ? Nausea.  ? Vomiting.  ? Fever.  Summary   A ventral hernia is a bulge of tissue from inside the abdomen that pushes through a weak area of the muscles that form the front wall of the abdomen.   This condition is treated with surgery, which may be urgent depending on your hernia.   Do not lift anything that is heavier than 10 lb (4.5 kg), and follow activity instructions from your health care provider.  This information is not intended to replace advice given to you by your health care provider. Make sure you discuss any questions you have with your health care provider.  Document Released: 10/28/2012 Document Revised: 12/24/2017 Document Reviewed: 06/02/2017  Elsevier Interactive Patient Education  2019 [employer]

## 2019-01-21 NOTE — Progress Notes (Signed)
Brenda Avila; [id number]; June 10, 1976   HPI Patient is a 43 year old black female who was referred to my care by the emergency room for meant of a periumbilical hernia.  She was seen in the emergency room recently for periumbilical pain and was noted to have a hernia.  It was reducible.  She currently has no abdominal pain.  Swelling and pain made worse with straining. History reviewed. No pertinent past medical history.  Past Surgical History:  Procedure Laterality Date  . CESAREAN SECTION    . WRIST SURGERY      History reviewed. No pertinent family history.  No current outpatient medications on file prior to visit.   No current facility-administered medications on file prior to visit.     Allergies  Allergen Reactions  . Hydrocodone Itching  . Tramadol Other (See Comments)    Altered mental status    Social History   Substance and Sexual Activity  Alcohol Use No    Social History   Tobacco Use  Smoking Status Current Every Day Smoker  Smokeless Tobacco Never Used    Review of Systems  Constitutional: Positive for malaise/fatigue.  HENT: Negative.   Eyes: Negative.   Respiratory: Negative.   Cardiovascular: Negative.   Gastrointestinal: Positive for abdominal pain.  Genitourinary: Negative.   Musculoskeletal: Negative.   Skin: Negative.   Neurological: Negative.   Endo/Heme/Allergies: Negative.   Psychiatric/Behavioral: Negative.     Objective   Vitals:   01/21/19 0922  BP: 118/76  Pulse: 84  Resp: 18  Temp: (!) 97.3 F (36.3 C)    Physical Exam Vitals signs reviewed.  Constitutional:      Appearance: Normal appearance. She is not ill-appearing.  HENT:     Head: Normocephalic and atraumatic.  Cardiovascular:     Rate and Rhythm: Normal rate and regular rhythm.     Heart sounds: Normal heart sounds. No murmur. No friction rub.  Pulmonary:     Effort: Pulmonary effort is normal. No respiratory distress.     Breath sounds: Normal breath  sounds. No stridor. No wheezing, rhonchi or rales.  Abdominal:     General: Abdomen is flat. Bowel sounds are normal. There is no distension.     Palpations: Abdomen is soft. There is no mass.     Tenderness: There is no abdominal tenderness. There is no guarding or rebound.     Hernia: A hernia is present.     Comments: Incisional hernia present beneath infraumbilical surgical scar, easily reducible.  Skin:    General: Skin is warm and dry.  Neurological:     Mental Status: She is alert and oriented to person, place, and time.    ER notes reviewed. Assessment  Incisional hernia Plan   Patient scheduled for incisional herniorrhaphy with mesh on 01/27/2019.  The risks and benefits of the procedure including bleeding, infection, mesh use, and the possibility of recurrence of the hernia were fully explained to the patient, who gives informed consent.

## 2019-01-21 NOTE — H&P (Signed)
Brenda Avila; [id number]; 03/22/1976   HPI Patient is a 43-year-old black female who was referred to my care by the emergency room for meant of a periumbilical hernia.  She was seen in the emergency room recently for periumbilical pain and was noted to have a hernia.  It was reducible.  She currently has no abdominal pain.  Swelling and pain made worse with straining. History reviewed. No pertinent past medical history.  Past Surgical History:  Procedure Laterality Date  . CESAREAN SECTION    . WRIST SURGERY      History reviewed. No pertinent family history.  No current outpatient medications on file prior to visit.   No current facility-administered medications on file prior to visit.     Allergies  Allergen Reactions  . Hydrocodone Itching  . Tramadol Other (See Comments)    Altered mental status    Social History   Substance and Sexual Activity  Alcohol Use No    Social History   Tobacco Use  Smoking Status Current Every Day Smoker  Smokeless Tobacco Never Used    Review of Systems  Constitutional: Positive for malaise/fatigue.  HENT: Negative.   Eyes: Negative.   Respiratory: Negative.   Cardiovascular: Negative.   Gastrointestinal: Positive for abdominal pain.  Genitourinary: Negative.   Musculoskeletal: Negative.   Skin: Negative.   Neurological: Negative.   Endo/Heme/Allergies: Negative.   Psychiatric/Behavioral: Negative.     Objective   Vitals:   01/21/19 0922  BP: 118/76  Pulse: 84  Resp: 18  Temp: (!) 97.3 F (36.3 C)    Physical Exam Vitals signs reviewed.  Constitutional:      Appearance: Normal appearance. She is not ill-appearing.  HENT:     Head: Normocephalic and atraumatic.  Cardiovascular:     Rate and Rhythm: Normal rate and regular rhythm.     Heart sounds: Normal heart sounds. No murmur. No friction rub.  Pulmonary:     Effort: Pulmonary effort is normal. No respiratory distress.     Breath sounds: Normal breath  sounds. No stridor. No wheezing, rhonchi or rales.  Abdominal:     General: Abdomen is flat. Bowel sounds are normal. There is no distension.     Palpations: Abdomen is soft. There is no mass.     Tenderness: There is no abdominal tenderness. There is no guarding or rebound.     Hernia: A hernia is present.     Comments: Incisional hernia present beneath infraumbilical surgical scar, easily reducible.  Skin:    General: Skin is warm and dry.  Neurological:     Mental Status: She is alert and oriented to person, place, and time.    ER notes reviewed. Assessment  Incisional hernia Plan   Patient scheduled for incisional herniorrhaphy with mesh on 01/27/2019.  The risks and benefits of the procedure including bleeding, infection, mesh use, and the possibility of recurrence of the hernia were fully explained to the patient, who gives informed consent. 

## 2019-01-22 NOTE — Patient Instructions (Signed)
REAL DAVID  01/22/2019     @PREFPERIOPPHARMACY @   Your procedure is scheduled on  01/27/2019.  Report to [facility] at  700  A.M.  Call this number if you have problems the morning of surgery:  [phone]   Remember:  Do not eat or drink after midnight.                          Take these medicines the morning of surgery with A SIP OF WATER  None    Do not wear jewelry, make-up or nail polish.  Do not wear lotions, powders, or perfumes, or deodorant.  Do not shave 48 hours prior to surgery.  Men may shave face and neck.  Do not bring valuables to the hospital.  [facility] is not responsible for any belongings or valuables.  Contacts, dentures or bridgework may not be worn into surgery.  Leave your suitcase in the car.  After surgery it may be brought to your room.  For patients admitted to the hospital, discharge time will be determined by your treatment team.  Patients discharged the day of surgery will not be allowed to drive home.   Name and phone number of your driver:   family Special instructions:  None  Please read over the following fact sheets that you were given. Anesthesia Post-op Instructions and Care and Recovery After Surgery       Open Hernia Repair, Adult, Care After These instructions give you information about caring for yourself after your procedure. Your doctor may also give you more specific instructions. If you have problems or questions, contact your doctor. Follow these instructions at home: Surgical cut (incision) care   Follow instructions from your doctor about how to take care of your surgical cut area. Make sure you: ? Wash your hands with soap and water before you change your bandage (dressing). If you cannot use soap and water, use hand sanitizer. ? Change your bandage as told by your doctor. ? Leave stitches (sutures), skin glue, or skin tape (adhesive) strips in place. They may need to stay in place for 2 weeks or longer.  If tape strips get loose and curl up, you may trim the loose edges. Do not remove tape strips completely unless your doctor says it is okay.  Check your surgical cut every day for signs of infection. Check for: ? More redness, swelling, or pain. ? More fluid or blood. ? Warmth. ? Pus or a bad smell. Activity  Do not drive or use heavy machinery while taking prescription pain medicine. Do not drive until your doctor says it is okay.  Until your doctor says it is okay: ? Do not lift anything that is heavier than 10 lb (4.5 kg). ? Do not play contact sports.  Return to your normal activities as told by your doctor. Ask your doctor what activities are safe. General instructions  To prevent or treat having a hard time pooping (constipation) while you are taking prescription pain medicine, your doctor may recommend that you: ? Drink enough fluid to keep your pee (urine) clear or pale yellow. ? Take over-the-counter or prescription medicines. ? Eat foods that are high in fiber, such as fresh fruits and vegetables, whole grains, and beans. ? Limit foods that are high in fat and processed sugars, such as fried and sweet foods.  Take over-the-counter and prescription medicines only as told by your doctor.  Do not take baths, swim, or use a hot tub until your doctor says it is okay.  Keep all follow-up visits as told by your doctor. This is important. Contact a doctor if:  You develop a rash.  You have more redness, swelling, or pain around your surgical cut.  You have more fluid or blood coming from your surgical cut.  Your surgical cut feels warm to the touch.  You have pus or a bad smell coming from your surgical cut.  You have a fever or chills.  You have blood in your poop (stool).  You have not pooped in 2-3 days.  Medicine does not help your pain. Get help right away if:  You have chest pain or you are short of breath.  You feel light-headed.  You feel weak and dizzy  (feel faint).  You have very bad pain.  You throw up (vomit) and your pain is worse. This information is not intended to replace advice given to you by your health care provider. Make sure you discuss any questions you have with your health care provider. Document Released: 12/02/2014 Document Revised: 05/31/2016 Document Reviewed: 04/24/2016 Elsevier Interactive Patient Education  2019 [employer] General Anesthesia, Adult, Care After This sheet gives you information about how to care for yourself after your procedure. Your health care provider may also give you more specific instructions. If you have problems or questions, contact your health care provider. What can I expect after the procedure? After the procedure, the following side effects are common:  Pain or discomfort at the IV site.  Nausea.  Vomiting.  Sore throat.  Trouble concentrating.  Feeling cold or chills.  Weak or tired.  Sleepiness and fatigue.  Soreness and body aches. These side effects can affect parts of the body that were not involved in surgery. Follow these instructions at home:  For at least 24 hours after the procedure:  Have a responsible adult stay with you. It is important to have someone help care for you until you are awake and alert.  Rest as needed.  Do not: ? Participate in activities in which you could fall or become injured. ? Drive. ? Use heavy machinery. ? Drink alcohol. ? Take sleeping pills or medicines that cause drowsiness. ? Make important decisions or sign legal documents. ? Take care of children on your own. Eating and drinking  Follow any instructions from your health care provider about eating or drinking restrictions.  When you feel hungry, start by eating small amounts of foods that are soft and easy to digest (bland), such as toast. Gradually return to your regular diet.  Drink enough fluid to keep your urine pale yellow.  If you vomit, rehydrate by drinking  water, juice, or clear broth. General instructions  If you have sleep apnea, surgery and certain medicines can increase your risk for breathing problems. Follow instructions from your health care provider about wearing your sleep device: ? Anytime you are sleeping, including during daytime naps. ? While taking prescription pain medicines, sleeping medicines, or medicines that make you drowsy.  Return to your normal activities as told by your health care provider. Ask your health care provider what activities are safe for you.  Take over-the-counter and prescription medicines only as told by your health care provider.  If you smoke, do not smoke without supervision.  Keep all follow-up visits as told by your health care provider. This is important. Contact a health care provider if:  You have nausea  or vomiting that does not get better with medicine.  You cannot eat or drink without vomiting.  You have pain that does not get better with medicine.  You are unable to pass urine.  You develop a skin rash.  You have a fever.  You have redness around your IV site that gets worse. Get help right away if:  You have difficulty breathing.  You have chest pain.  You have blood in your urine or stool, or you vomit blood. Summary  After the procedure, it is common to have a sore throat or nausea. It is also common to feel tired.  Have a responsible adult stay with you for the first 24 hours after general anesthesia. It is important to have someone help care for you until you are awake and alert.  When you feel hungry, start by eating small amounts of foods that are soft and easy to digest (bland), such as toast. Gradually return to your regular diet.  Drink enough fluid to keep your urine pale yellow.  Return to your normal activities as told by your health care provider. Ask your health care provider what activities are safe for you. This information is not intended to replace  advice given to you by your health care provider. Make sure you discuss any questions you have with your health care provider. Document Released: 02/17/2001 Document Revised: 06/27/2017 Document Reviewed: 06/27/2017 Elsevier Interactive Patient Education  2019 [employer].

## 2019-01-26 ENCOUNTER — Encounter (HOSPITAL_COMMUNITY)
Admission: RE | Admit: 2019-01-26 | Discharge: 2019-01-26 | Disposition: A | Discharge status: Home / Self Care | Payer: BLUE CROSS/BLUE SHIELD | Source: Ambulatory Visit | Attending: General Surgery | Admitting: General Surgery

## 2019-01-26 ENCOUNTER — Encounter (HOSPITAL_COMMUNITY): Payer: Self-pay

## 2019-01-26 ENCOUNTER — Other Ambulatory Visit: Payer: Self-pay

## 2019-01-26 DIAGNOSIS — Z01812 Encounter for preprocedural laboratory examination: Secondary | ICD-10-CM | POA: Insufficient documentation

## 2019-01-26 LAB — HCG, SERUM, QUALITATIVE: Preg, Serum: NEGATIVE

## 2019-01-27 ENCOUNTER — Other Ambulatory Visit: Payer: Self-pay

## 2019-01-27 ENCOUNTER — Ambulatory Visit (HOSPITAL_COMMUNITY)
Admission: RE | Admit: 2019-01-27 | Discharge: 2019-01-27 | Disposition: A | Discharge status: Home / Self Care | Payer: BLUE CROSS/BLUE SHIELD | Attending: General Surgery | Admitting: General Surgery

## 2019-01-27 ENCOUNTER — Ambulatory Visit (HOSPITAL_COMMUNITY): Payer: BLUE CROSS/BLUE SHIELD | Admitting: Anesthesiology

## 2019-01-27 ENCOUNTER — Encounter (HOSPITAL_COMMUNITY)
Admission: RE | Disposition: A | Discharge status: Home / Self Care | Payer: Self-pay | Source: Home / Self Care | Attending: General Surgery

## 2019-01-27 DIAGNOSIS — F17200 Nicotine dependence, unspecified, uncomplicated: Secondary | ICD-10-CM | POA: Diagnosis not present

## 2019-01-27 DIAGNOSIS — E669 Obesity, unspecified: Secondary | ICD-10-CM | POA: Diagnosis not present

## 2019-01-27 DIAGNOSIS — K432 Incisional hernia without obstruction or gangrene: Secondary | ICD-10-CM

## 2019-01-27 DIAGNOSIS — Z885 Allergy status to narcotic agent status: Secondary | ICD-10-CM | POA: Insufficient documentation

## 2019-01-27 DIAGNOSIS — Z888 Allergy status to other drugs, medicaments and biological substances status: Secondary | ICD-10-CM | POA: Diagnosis not present

## 2019-01-27 HISTORY — PX: INCISIONAL HERNIA REPAIR: SHX193

## 2019-01-27 SURGERY — REPAIR, HERNIA, INCISIONAL
Anesthesia: General | Site: Abdomen

## 2019-01-27 MED ORDER — LACTATED RINGERS IV SOLN: INTRAVENOUS | Status: DC

## 2019-01-27 MED ORDER — HYDROCODONE-ACETAMINOPHEN 7.5-325 MG PO TABS: 1.0000 | ORAL_TABLET | Freq: Once | ORAL | Status: DC | PRN

## 2019-01-27 MED ORDER — POVIDONE-IODINE 10 % OINT PACKET
TOPICAL_OINTMENT | CUTANEOUS | Status: DC | PRN
  Administered 2019-01-27: 1 via TOPICAL

## 2019-01-27 MED ORDER — ONDANSETRON HCL 4 MG/2ML IJ SOLN
INTRAMUSCULAR | Status: DC | PRN
  Administered 2019-01-27: 4 mg via INTRAVENOUS

## 2019-01-27 MED ORDER — 0.9 % SODIUM CHLORIDE (POUR BTL) OPTIME
TOPICAL | Status: DC | PRN
  Administered 2019-01-27: 1000 mL

## 2019-01-27 MED ORDER — BUPIVACAINE LIPOSOME 1.3 % IJ SUSP
INTRAMUSCULAR | Status: DC | PRN
  Administered 2019-01-27: 20 mL

## 2019-01-27 MED ORDER — HYDROMORPHONE HCL 1 MG/ML IJ SOLN
0.2500 mg | INTRAMUSCULAR | Status: DC | PRN
  Administered 2019-01-27 (×2): 0.5 mg via INTRAVENOUS
  Filled 2019-01-27 (×2): qty 0.5

## 2019-01-27 MED ORDER — PROPOFOL 10 MG/ML IV BOLUS
INTRAVENOUS | Status: AC
  Filled 2019-01-27: qty 20

## 2019-01-27 MED ORDER — LIDOCAINE 2% (20 MG/ML) 5 ML SYRINGE
INTRAMUSCULAR | Status: DC | PRN
  Administered 2019-01-27: 40 mg via INTRAVENOUS

## 2019-01-27 MED ORDER — BUPIVACAINE HCL (PF) 0.5 % IJ SOLN
INTRAMUSCULAR | Status: AC
  Filled 2019-01-27: qty 30

## 2019-01-27 MED ORDER — FENTANYL CITRATE (PF) 100 MCG/2ML IJ SOLN
INTRAMUSCULAR | Status: DC | PRN
  Administered 2019-01-27 (×3): 50 ug via INTRAVENOUS
  Administered 2019-01-27 (×4): 25 ug via INTRAVENOUS

## 2019-01-27 MED ORDER — GLYCOPYRROLATE PF 0.2 MG/ML IJ SOSY
PREFILLED_SYRINGE | INTRAMUSCULAR | Status: DC | PRN
  Administered 2019-01-27: .2 mg via INTRAVENOUS

## 2019-01-27 MED ORDER — KETOROLAC TROMETHAMINE 30 MG/ML IJ SOLN: 30.0000 mg | Freq: Once | INTRAMUSCULAR | Status: DC

## 2019-01-27 MED ORDER — LACTATED RINGERS IV SOLN
INTRAVENOUS | Status: DC
  Administered 2019-01-27: 08:00:00 via INTRAVENOUS

## 2019-01-27 MED ORDER — BUPIVACAINE LIPOSOME 1.3 % IJ SUSP
INTRAMUSCULAR | Status: AC
  Filled 2019-01-27: qty 20

## 2019-01-27 MED ORDER — PROPOFOL 10 MG/ML IV BOLUS
INTRAVENOUS | Status: DC | PRN
  Administered 2019-01-27: 200 mg via INTRAVENOUS
  Administered 2019-01-27: 50 mg via INTRAVENOUS

## 2019-01-27 MED ORDER — CEFAZOLIN SODIUM-DEXTROSE 2-4 GM/100ML-% IV SOLN
INTRAVENOUS | Status: AC
  Filled 2019-01-27: qty 100

## 2019-01-27 MED ORDER — MIDAZOLAM HCL 2 MG/2ML IJ SOLN
INTRAMUSCULAR | Status: AC
  Filled 2019-01-27: qty 2

## 2019-01-27 MED ORDER — POVIDONE-IODINE 10 % EX OINT
TOPICAL_OINTMENT | CUTANEOUS | Status: AC
  Filled 2019-01-27: qty 1

## 2019-01-27 MED ORDER — OXYCODONE-ACETAMINOPHEN 7.5-325 MG PO TABS: 1.0000 | ORAL_TABLET | Freq: Four times a day (QID) | ORAL | 0 refills | Status: DC | PRN

## 2019-01-27 MED ORDER — CHLORHEXIDINE GLUCONATE CLOTH 2 % EX PADS: 6.0000 | MEDICATED_PAD | Freq: Once | CUTANEOUS | Status: DC

## 2019-01-27 MED ORDER — PROMETHAZINE HCL 25 MG/ML IJ SOLN: 6.2500 mg | INTRAMUSCULAR | Status: DC | PRN

## 2019-01-27 MED ORDER — CEFAZOLIN SODIUM-DEXTROSE 2-4 GM/100ML-% IV SOLN
2.0000 g | INTRAVENOUS | Status: AC
  Administered 2019-01-27: 2 g via INTRAVENOUS

## 2019-01-27 MED ORDER — FENTANYL CITRATE (PF) 250 MCG/5ML IJ SOLN
INTRAMUSCULAR | Status: AC
  Filled 2019-01-27: qty 5

## 2019-01-27 MED ORDER — MEPERIDINE HCL 50 MG/ML IJ SOLN: 6.2500 mg | INTRAMUSCULAR | Status: DC | PRN

## 2019-01-27 MED ORDER — DEXAMETHASONE SODIUM PHOSPHATE 4 MG/ML IJ SOLN
INTRAMUSCULAR | Status: DC | PRN
  Administered 2019-01-27: 10 mg via INTRAVENOUS

## 2019-01-27 SURGICAL SUPPLY — 44 items
BLADE SURG SZ11 CARB STEEL (BLADE) ×3 IMPLANT
CHLORAPREP W/TINT 26ML (MISCELLANEOUS) ×3 IMPLANT
CLOTH BEACON ORANGE TIMEOUT ST (SAFETY) ×3 IMPLANT
COVER LIGHT HANDLE STERIS (MISCELLANEOUS) ×6 IMPLANT
COVER WAND RF STERILE (DRAPES) ×3 IMPLANT
ELECT REM PT RETURN 9FT ADLT (ELECTROSURGICAL) ×3
ELECTRODE REM PT RTRN 9FT ADLT (ELECTROSURGICAL) ×1 IMPLANT
GAUZE SPONGE 4X4 12PLY STRL (GAUZE/BANDAGES/DRESSINGS) ×3 IMPLANT
GLOVE BIOGEL M 6.5 STRL (GLOVE) ×3 IMPLANT
GLOVE BIOGEL M 7.0 STRL (GLOVE) ×3 IMPLANT
GLOVE BIOGEL PI IND STRL 6.5 (GLOVE) ×1 IMPLANT
GLOVE BIOGEL PI IND STRL 7.0 (GLOVE) ×1 IMPLANT
GLOVE BIOGEL PI INDICATOR 6.5 (GLOVE) ×2
GLOVE BIOGEL PI INDICATOR 7.0 (GLOVE) ×2
GLOVE SURG SS PI 7.5 STRL IVOR (GLOVE) ×3 IMPLANT
GOWN STRL REUS W/ TWL LRG LVL3 (GOWN DISPOSABLE) ×2 IMPLANT
GOWN STRL REUS W/ TWL XL LVL3 (GOWN DISPOSABLE) ×1 IMPLANT
GOWN STRL REUS W/TWL LRG LVL3 (GOWN DISPOSABLE) ×4
GOWN STRL REUS W/TWL XL LVL3 (GOWN DISPOSABLE) ×2
INST SET MINOR GENERAL (KITS) ×3 IMPLANT
KIT TURNOVER KIT A (KITS) ×3 IMPLANT
LIGASURE IMPACT 36 18CM CVD LR (INSTRUMENTS) ×3 IMPLANT
MANIFOLD NEPTUNE II (INSTRUMENTS) ×3 IMPLANT
MESH VENTRALEX ST 2.5 CRC MED (Mesh General) ×3 IMPLANT
NEEDLE HYPO 21X1.5 SAFETY (NEEDLE) ×3 IMPLANT
NS IRRIG 1000ML POUR BTL (IV SOLUTION) ×3 IMPLANT
PACK MINOR (CUSTOM PROCEDURE TRAY) ×3 IMPLANT
PAD ARMBOARD 7.5X6 YLW CONV (MISCELLANEOUS) ×3 IMPLANT
SET BASIN LINEN APH (SET/KITS/TRAYS/PACK) ×3 IMPLANT
STAPLER VISISTAT (STAPLE) ×3 IMPLANT
SUT ETHIBOND 0 MO6 C/R (SUTURE) ×3 IMPLANT
SUT NOVA NAB GS-21 1 T12 (SUTURE) IMPLANT
SUT NOVA NAB GS-22 2 2-0 T-19 (SUTURE) IMPLANT
SUT NOVA NAB GS-26 0 60 (SUTURE) IMPLANT
SUT PROLENE 0 CT 1 CR/8 (SUTURE) IMPLANT
SUT SILK 2 0 (SUTURE)
SUT SILK 2-0 18XBRD TIE 12 (SUTURE) IMPLANT
SUT VIC AB 2-0 CT1 27 (SUTURE) ×2
SUT VIC AB 2-0 CT1 TAPERPNT 27 (SUTURE) ×1 IMPLANT
SUT VIC AB 3-0 SH 27 (SUTURE) ×2
SUT VIC AB 3-0 SH 27X BRD (SUTURE) ×1 IMPLANT
SUT VICRYL AB 2 0 TIES (SUTURE) IMPLANT
SYR 20CC LL (SYRINGE) ×3 IMPLANT
TAPE PAPER 2X10 WHT MICROPORE (GAUZE/BANDAGES/DRESSINGS) ×3 IMPLANT

## 2019-01-27 NOTE — Anesthesia Preprocedure Evaluation (Signed)
Anesthesia Evaluation    Airway Mallampati: II       Dental  (+) Teeth Intact, Loose   Pulmonary Current Smoker,    breath sounds clear to auscultation + decreased breath sounds      Cardiovascular  Rhythm:regular     Neuro/Psych    GI/Hepatic   Endo/Other    Renal/GU      Musculoskeletal   Abdominal   Peds  Hematology   Anesthesia Other Findings Obesity Denies any sig PMH Loose rear, lower right molar  Reproductive/Obstetrics                             Anesthesia Physical Anesthesia Plan  ASA: II  Anesthesia Plan: General   Post-op Pain Management:    Induction:   PONV Risk Score and Plan:   Airway Management Planned:   Additional Equipment:   Intra-op Plan:   Post-operative Plan:   Informed Consent:     Dental Advisory Given  Plan Discussed with: Anesthesiologist  Anesthesia Plan Comments:         Anesthesia Quick Evaluation

## 2019-01-27 NOTE — Anesthesia Procedure Notes (Signed)
Procedure Name: LMA Insertion Date/Time: 01/27/2019 9:24 AM Performed by: Adams, Amy A, CRNA Pre-anesthesia Checklist: Patient identified, Emergency Drugs available, Suction available, Timeout performed and Patient being monitored Patient Re-evaluated:Patient Re-evaluated prior to induction Oxygen Delivery Method: Circle system utilized Preoxygenation: Pre-oxygenation with 100% oxygen Induction Type: IV induction LMA: LMA inserted LMA Size: 4.0 Placement Confirmation: positive ETCO2 and breath sounds checked- equal and bilateral Tube secured with: Tape

## 2019-01-27 NOTE — Anesthesia Postprocedure Evaluation (Signed)
Anesthesia Post Note  Patient: Brenda Avila  Procedure(s) Performed: Linus Mako WITH MESH (N/A Abdomen)  Patient location during evaluation: PACU Anesthesia Type: General Level of consciousness: awake and alert and oriented Pain management: pain level controlled Vital Signs Assessment: post-procedure vital signs reviewed and stable Respiratory status: spontaneous breathing Cardiovascular status: stable Postop Assessment: no apparent nausea or vomiting Anesthetic complications: no     Last Vitals:  Vitals:   01/27/19 1028 01/27/19 1030  BP: 132/78 124/68  Pulse: 87 88  Resp: 12 (!) 21  Temp: 36.4 C   SpO2: 96% 97%    Last Pain:  Vitals:   01/27/19 1040  TempSrc:   PainSc: Asleep                 ADAMS, AMY A

## 2019-01-27 NOTE — Interval H&P Note (Signed)
History and Physical Interval Note:  01/27/2019 8:52 AM  Brenda Avila  has presented today for surgery, with the diagnosis of incisional hernia  The various methods of treatment have been discussed with the patient and family. After consideration of risks, benefits and other options for treatment, the patient has consented to  Procedure(s): HERNIA REPAIR INCISIONAL WITH MESH (N/A) as a surgical intervention .  The patient's history has been reviewed, patient examined, no change in status, stable for surgery.  I have reviewed the patient's chart and labs.  Questions were answered to the patient's satisfaction.     Franky Macho

## 2019-01-27 NOTE — Discharge Instructions (Signed)
Open Hernia Repair, Adult, Care After °This sheet gives you information about how to care for yourself after your procedure. Your health care provider may also give you more specific instructions. If you have problems or questions, contact your health care provider. °What can I expect after the procedure? °After the procedure, it is common to have: °· Mild discomfort. °· Slight bruising. °· Minor swelling. °· Pain in the abdomen. °Follow these instructions at home: °Incision care ° °· Follow instructions from your health care provider about how to take care of your incision area. Make sure you: °? Wash your hands with soap and water before you change your bandage (dressing). If soap and water are not available, use hand sanitizer. °? Change your dressing as told by your health care provider. °? Leave stitches (sutures), skin glue, or adhesive strips in place. These skin closures may need to stay in place for 2 weeks or longer. If adhesive strip edges start to loosen and curl up, you may trim the loose edges. Do not remove adhesive strips completely unless your health care provider tells you to do that. °· Check your incision area every day for signs of infection. Check for: °? More redness, swelling, or pain. °? More fluid or blood. °? Warmth. °? Pus or a bad smell. °Activity °· Do not drive or use heavy machinery while taking prescription pain medicine. Do not drive until your health care provider approves. °· Until your health care provider approves: °? Do not lift anything that is heavier than 10 lb (4.5 kg). °? Do not play contact sports. °· Return to your normal activities as told by your health care provider. Ask your health care provider what activities are safe. °General instructions °· To prevent or treat constipation while you are taking prescription pain medicine, your health care provider may recommend that you: °? Drink enough fluid to keep your urine clear or pale yellow. °? Take over-the-counter or  prescription medicines. °? Eat foods that are high in fiber, such as fresh fruits and vegetables, whole grains, and beans. °? Limit foods that are high in fat and processed sugars, such as fried and sweet foods. °· Take over-the-counter and prescription medicines only as told by your health care provider. °· Do not take tub baths or go swimming until your health care provider approves. °· Keep all follow-up visits as told by your health care provider. This is important. °Contact a health care provider if: °· You develop a rash. °· You have more redness, swelling, or pain around your incision. °· You have more fluid or blood coming from your incision. °· Your incision feels warm to the touch. °· You have pus or a bad smell coming from your incision. °· You have a fever or chills. °· You have blood in your stool (feces). °· You have not had a bowel movement in 2-3 days. °· Your pain is not controlled with medicine. °Get help right away if: °· You have chest pain or shortness of breath. °· You feel light-headed or feel faint. °· You have severe pain. °· You vomit and your pain is worse. °This information is not intended to replace advice given to you by your health care provider. Make sure you discuss any questions you have with your health care provider. °Document Released: 05/31/2005 Document Revised: 05/31/2016 Document Reviewed: 04/24/2016 °Elsevier Interactive Patient Education © 2019 Elsevier Inc. ° °General Anesthesia, Adult, Care After °This sheet gives you information about how to care for yourself after   your procedure. Your health care provider may also give you more specific instructions. If you have problems or questions, contact your health care provider. °What can I expect after the procedure? °After the procedure, the following side effects are common: °· Pain or discomfort at the IV site. °· Nausea. °· Vomiting. °· Sore throat. °· Trouble concentrating. °· Feeling cold or chills. °· Weak or  tired. °· Sleepiness and fatigue. °· Soreness and body aches. These side effects can affect parts of the body that were not involved in surgery. °Follow these instructions at home: ° °For at least 24 hours after the procedure: °· Have a responsible adult stay with you. It is important to have someone help care for you until you are awake and alert. °· Rest as needed. °· Do not: °? Participate in activities in which you could fall or become injured. °? Drive. °? Use heavy machinery. °? Drink alcohol. °? Take sleeping pills or medicines that cause drowsiness. °? Make important decisions or sign legal documents. °? Take care of children on your own. °Eating and drinking °· Follow any instructions from your health care provider about eating or drinking restrictions. °· When you feel hungry, start by eating small amounts of foods that are soft and easy to digest (bland), such as toast. Gradually return to your regular diet. °· Drink enough fluid to keep your urine pale yellow. °· If you vomit, rehydrate by drinking water, juice, or clear broth. °General instructions °· If you have sleep apnea, surgery and certain medicines can increase your risk for breathing problems. Follow instructions from your health care provider about wearing your sleep device: °? Anytime you are sleeping, including during daytime naps. °? While taking prescription pain medicines, sleeping medicines, or medicines that make you drowsy. °· Return to your normal activities as told by your health care provider. Ask your health care provider what activities are safe for you. °· Take over-the-counter and prescription medicines only as told by your health care provider. °· If you smoke, do not smoke without supervision. °· Keep all follow-up visits as told by your health care provider. This is important. °Contact a health care provider if: °· You have nausea or vomiting that does not get better with medicine. °· You cannot eat or drink without  vomiting. °· You have pain that does not get better with medicine. °· You are unable to pass urine. °· You develop a skin rash. °· You have a fever. °· You have redness around your IV site that gets worse. °Get help right away if: °· You have difficulty breathing. °· You have chest pain. °· You have blood in your urine or stool, or you vomit blood. °Summary °· After the procedure, it is common to have a sore throat or nausea. It is also common to feel tired. °· Have a responsible adult stay with you for the first 24 hours after general anesthesia. It is important to have someone help care for you until you are awake and alert. °· When you feel hungry, start by eating small amounts of foods that are soft and easy to digest (bland), such as toast. Gradually return to your regular diet. °· Drink enough fluid to keep your urine pale yellow. °· Return to your normal activities as told by your health care provider. Ask your health care provider what activities are safe for you. °This information is not intended to replace advice given to you by your health care provider. Make sure you discuss   any questions you have with your health care provider. °Document Released: 02/17/2001 Document Revised: 06/27/2017 Document Reviewed: 06/27/2017 °Elsevier Interactive Patient Education © 2019 Elsevier Inc. ° ° °

## 2019-01-27 NOTE — Transfer of Care (Signed)
Immediate Anesthesia Transfer of Care Note  Patient: Brenda Avila  Procedure(s) Performed: Linus Mako WITH MESH (N/A Abdomen)  Patient Location: PACU  Anesthesia Type:General  Level of Consciousness: awake, alert , oriented and patient cooperative  Airway & Oxygen Therapy: Patient Spontanous Breathing  Post-op Assessment: Report given to RN and Post -op Vital signs reviewed and stable  Post vital signs: Reviewed and stable  Last Vitals:  Vitals Value Taken Time  BP 124/68 01/27/2019 10:30 AM  Temp    Pulse 78 01/27/2019 10:34 AM  Resp 13 01/27/2019 10:34 AM  SpO2 95 % 01/27/2019 10:34 AM  Vitals shown include unvalidated device data.  Last Pain:  Vitals:   01/27/19 0745  TempSrc: Oral  PainSc: 0-No pain      Patients Stated Pain Goal: 5 (01/27/19 0745)  Complications: No apparent anesthesia complications

## 2019-01-27 NOTE — Op Note (Signed)
Patient:  Brenda Avila  DOB:  19-Dec-1975  MRN:  [id number]   Preop Diagnosis: Incisional hernia  Postop Diagnosis: Same  Procedure: Incisional herniorrhaphy with mesh  Surgeon: Franky Macho, MD  Anes: General   Indications: Patient is a 43 year old black female who presents with an incisional hernia at the level of the umbilicus.  The risks and benefits of the procedure including bleeding, infection, mesh use, and the possibility of recurrence of the hernia were fully explained to the patient, who gave informed consent.  Procedure note: The patient was placed in the supine position.  After general anesthesia was administered, the abdomen was prepped and draped using the usual sterile technique with ChloraPrep.  Surgical site confirmation was performed.  The infraumbilical incision through the previous surgical scar was made down to the fascia.  The hernia sac was identified and excised down to the abdominal wall.  Some attached omentum was freed away from the hernia sac, but given the significant amount of omentum adhesed to the hernia sac, it was elected to proceed with using the LigaSure to excise some of the omentum.  Once this was done, the omentum was reduced into the abdominal cavity.  I palpated the anterior abdominal wall and no other defects were noted.  The defect measured approximately 2 cm in its greatest diameter.  A 6.4 cm Bard Ventralax ST patch was then inserted and tacked to the abdominal wall using 0 Ethibond interrupted sutures.  The overlying fascia was reapproximated transversely using 0 Ethibond interrupted sutures.  The base the umbilicus was secured back to the fascia using a 2-0 Vicryl interrupted suture.  The subcutaneous layer was reapproximated using 3-0 Vicryl interrupted suture.  Exparel was instilled into the surrounding wound.  The skin was closed using staples.  Betadine ointment and dry sterile dressings were applied.  All tape and needle counts were correct  at the end of the procedure.  The patient was awakened and transferred to PACU in stable condition.  Complications: None  EBL: 10 cc  Specimen: None

## 2019-01-28 ENCOUNTER — Encounter (HOSPITAL_COMMUNITY): Payer: Self-pay | Admitting: General Surgery

## 2019-02-04 ENCOUNTER — Ambulatory Visit (INDEPENDENT_AMBULATORY_CARE_PROVIDER_SITE_OTHER): Payer: Self-pay | Admitting: General Surgery

## 2019-02-04 ENCOUNTER — Other Ambulatory Visit: Payer: Self-pay

## 2019-02-04 ENCOUNTER — Encounter: Payer: Self-pay | Admitting: General Surgery

## 2019-02-04 VITALS — BP 124/86 | HR 82 | Temp 97.7°F | Resp 16 | Wt 211.2 lb

## 2019-02-04 DIAGNOSIS — Z09 Encounter for follow-up examination after completed treatment for conditions other than malignant neoplasm: Secondary | ICD-10-CM

## 2019-02-04 NOTE — Progress Notes (Signed)
Subjective:     Brenda Avila  Here for postoperative visit.  Doing well.  Has minimal pain. Objective:    BP 124/86 (BP Location: Left Arm, Patient Position: Sitting, Cuff Size: Large)   Pulse 82   Temp 97.7 F (36.5 C) (Temporal)   Resp 16   Wt 211 lb 3.2 oz (95.8 kg)   BMI 31.19 kg/m   General:  alert, cooperative and no distress  Abdomen soft, incision healing well.  May have a deep hematoma, but it is not severe.  She did have multiple subcutaneous hernia sacs at the time of surgery.  No evidence of recurrence of the hernia.     Assessment:    Doing well postoperatively.    Plan:   Return in 1 week for follow-up.  Will remove staples at that time.  Patient told to avoid any heavy lifting.

## 2019-02-11 ENCOUNTER — Other Ambulatory Visit: Payer: Self-pay

## 2019-02-11 ENCOUNTER — Ambulatory Visit: Payer: Self-pay | Admitting: General Surgery

## 2019-02-11 ENCOUNTER — Encounter: Payer: Self-pay | Admitting: General Surgery

## 2019-02-11 ENCOUNTER — Ambulatory Visit (INDEPENDENT_AMBULATORY_CARE_PROVIDER_SITE_OTHER): Payer: Self-pay | Admitting: General Surgery

## 2019-02-11 VITALS — BP 145/91 | HR 76 | Temp 98.1°F | Resp 20 | Wt 217.4 lb

## 2019-02-11 DIAGNOSIS — Z09 Encounter for follow-up examination after completed treatment for conditions other than malignant neoplasm: Secondary | ICD-10-CM

## 2019-02-11 NOTE — Progress Notes (Signed)
Subjective:     Brenda Avila  Here for postoperative visit.  Doing well.  Has no complaints. Objective:    BP (!) 145/91 (BP Location: Left Arm, Patient Position: Sitting, Cuff Size: Large)   Pulse 76   Temp 98.1 F (36.7 C) (Oral)   Resp 20   Wt 217 lb 6.4 oz (98.6 kg)   BMI 32.10 kg/m   General:  alert, cooperative and no distress  Abdomen soft, incision healing well.  Staples removed.     Assessment:    Doing well postoperatively.    Plan:   Increase activity as able.  May return to work without restrictions on 02/15/2019.  Follow-up here as needed.

## 2019-10-08 ENCOUNTER — Emergency Department (HOSPITAL_COMMUNITY)
Admission: EM | Admit: 2019-10-08 | Discharge: 2019-10-08 | Disposition: A | Discharge status: Home / Self Care | Payer: BLUE CROSS/BLUE SHIELD | Attending: Emergency Medicine | Admitting: Emergency Medicine

## 2019-10-08 ENCOUNTER — Other Ambulatory Visit: Payer: Self-pay

## 2019-10-08 ENCOUNTER — Encounter (HOSPITAL_COMMUNITY): Payer: Self-pay | Admitting: Emergency Medicine

## 2019-10-08 DIAGNOSIS — G5702 Lesion of sciatic nerve, left lower limb: Secondary | ICD-10-CM | POA: Insufficient documentation

## 2019-10-08 DIAGNOSIS — M7989 Other specified soft tissue disorders: Secondary | ICD-10-CM

## 2019-10-08 DIAGNOSIS — F17210 Nicotine dependence, cigarettes, uncomplicated: Secondary | ICD-10-CM | POA: Insufficient documentation

## 2019-10-08 NOTE — Discharge Instructions (Addendum)

## 2019-10-08 NOTE — ED Triage Notes (Signed)
Patient reports intermittent leg pain and numbness since a hernia surgery in April. No known injury, denies back pain.

## 2019-10-08 NOTE — ED Provider Notes (Signed)
[facility] EMERGENCY DEPARTMENT Provider Note   CSN: [id number] Arrival date & time: 10/08/19  1612     History   Chief Complaint Chief Complaint  Patient presents with  . Leg Pain    HPI Brenda Avila is a 43 y.o. female with no significant past medical history who presents today for evaluation of left-sided buttocks and back pain.  She reports that this has been intermittently present since she had a hernia surgery in March.  She reports the first time was approximately 1 month after the surgery.  She reports that her pain shoots down her left sided leg on the posterior lateral aspect.  She denies any changes to bowel or bladder function.  No history of IV drug use.  No back pain.  She denies any personal history of cancer.  She denies any weakness.  No fevers.       HPI  History reviewed. No pertinent past medical history.  Patient Active Problem List   Diagnosis Date Noted  . Incisional hernia, without obstruction or gangrene     Past Surgical History:  Procedure Laterality Date  . CESAREAN SECTION    . INCISIONAL HERNIA REPAIR N/A 01/27/2019   Procedure: Fatima Blank HERNIORRHAPHY WITH MESH;  Surgeon: Aviva Signs, MD;  Location: [facility];  Service: General;  Laterality: N/A;  . TUBAL LIGATION    . WRIST SURGERY       OB History    Gravida      Para      Term      Preterm      AB      Living  3     SAB      TAB      Ectopic      Multiple      Live Births               Home Medications    Prior to Admission medications   Medication Sig Start Date End Date Taking? Authorizing Provider  albuterol (PROVENTIL HFA;VENTOLIN HFA) 108 (90 Base) MCG/ACT inhaler Inhale 2 puffs into the lungs every 6 (six) hours as needed for wheezing or shortness of breath.    [provider]  BIOTIN PO Take 1 tablet by mouth daily.    [provider]  citalopram (CELEXA) 20 MG tablet Take 20 mg by mouth daily.    [provider]  naproxen  sodium (ALEVE) 220 MG tablet Take 440 mg by mouth 2 (two) times daily as needed (pain).    [provider]    Family History History reviewed. No pertinent family history.  Social History Social History   Tobacco Use  . Smoking status: Current Every Day Smoker    Packs/day: 1.00  . Smokeless tobacco: Never Used  Substance Use Topics  . Alcohol use: No  . Drug use: No     Allergies   Hydrocodone and Tramadol   Review of Systems Review of Systems  Constitutional: Negative for chills and fever.  Gastrointestinal: Negative for abdominal pain and nausea.  Musculoskeletal: Negative for back pain and neck pain.       Left-sided buttock pain shooting down the left leg  Neurological: Negative for weakness.  All other systems reviewed and are negative.    Physical Exam Updated Vital Signs BP 137/81   Pulse 85   Temp 98.8 F (37.1 C) (Oral)   Resp 16   Ht 5\' 9"  (1.753 m)   Wt 93.9 kg  LMP 09/22/2019   SpO2 99%   BMI 30.57 kg/m   Physical Exam Vitals signs and nursing note reviewed.  Constitutional:      General: She is not in acute distress.    Appearance: She is well-developed. She is not diaphoretic.  HENT:     Head: Normocephalic and atraumatic.  Eyes:     General: No scleral icterus.       Right eye: No discharge.        Left eye: No discharge.     Conjunctiva/sclera: Conjunctivae normal.  Neck:     Musculoskeletal: Normal range of motion.  Cardiovascular:     Rate and Rhythm: Normal rate and regular rhythm.     Pulses: Normal pulses.     Comments: 2+ DP/PT pulses bilaterally. Pulmonary:     Effort: Pulmonary effort is normal. No respiratory distress.     Breath sounds: No stridor.  Abdominal:     General: There is no distension.  Musculoskeletal:        General: No deformity.     Right lower leg: No edema.     Left lower leg: Edema present.     Comments: There is generalized tenderness to palpation over the left-sided piriformis  muscle/buttock.  Palpation here both recreates and exacerbates her reported pain and radiation down her left leg.  There is no midline tenderness to palpation, step-offs, or deformities of T/L-spine.  Skin:    General: Skin is warm and dry.  Neurological:     Mental Status: She is alert.     Motor: No abnormal muscle tone.     Comments: Sensation intact to bilateral lower extremities to right touch.  5/5 strength bilateral ankle dorsiflexion and plantar flexion.  Psychiatric:        Behavior: Behavior normal.      ED Treatments / Results  Labs (all labs ordered are listed, but only abnormal results are displayed) Labs Reviewed - No data to display  EKG None  Radiology No results found.  Procedures Procedures (including critical care time)  Medications Ordered in ED Medications - No data to display   Initial Impression / Assessment and Plan / ED Course  I have reviewed the triage vital signs and the nursing notes.  Pertinent labs & imaging results that were available during my care of the patient were reviewed by me and considered in my medical decision making (see chart for details).       Patient presents today for evaluation of left-sided buttock and leg pain with trace edema.  This is been intermittent since she had a hernia surgery in March.  On exam she has tenderness over the piriformis muscle, palpation here both recreates her leg pain and radiation.  She does have trace edema of the left lower extremity.  Attempted to obtain DVT study, however unable to do so tonight.  Orders placed for outpatient DVT study.  Patient and I discussed risks versus benefits of anticoagulation.  She made the informed decision to decline anticoagulant therapy overnight stating her understanding of the risks of this decision.  For her suspected piriformis syndrome recommended PCP follow-up for consideration of physical therapy, OTC pain medicines along with gentle stretching and range of  motion exercises.  No changes to bowel/bladder function.  No red flag signs.  Return precautions were discussed with patient who states their understanding.  At the time of discharge patient denied any unaddressed complaints or concerns.  Patient is agreeable for discharge home.  Final Clinical Impressions(s) / ED Diagnoses   Final diagnoses:  Piriformis syndrome of left side  Left leg swelling    ED Discharge Orders         Ordered    US Venous Img Lower Unilateral Left     10/08/19 1928           Norman Clay 10/08/19 2221    Bethann Berkshire, MD 10/08/19 2307

## 2021-11-07 ENCOUNTER — Encounter (HOSPITAL_COMMUNITY): Payer: Self-pay

## 2021-11-07 ENCOUNTER — Other Ambulatory Visit: Payer: Self-pay

## 2021-11-07 ENCOUNTER — Emergency Department (HOSPITAL_COMMUNITY)
Admission: EM | Admit: 2021-11-07 | Discharge: 2021-11-07 | Disposition: A | Discharge status: Home / Self Care | Payer: BC Managed Care – PPO | Attending: Emergency Medicine | Admitting: Emergency Medicine

## 2021-11-07 ENCOUNTER — Emergency Department (HOSPITAL_COMMUNITY): Payer: BC Managed Care – PPO

## 2021-11-07 DIAGNOSIS — M25511 Pain in right shoulder: Secondary | ICD-10-CM | POA: Diagnosis present

## 2021-11-07 DIAGNOSIS — F17210 Nicotine dependence, cigarettes, uncomplicated: Secondary | ICD-10-CM | POA: Insufficient documentation

## 2021-11-07 DIAGNOSIS — M62838 Other muscle spasm: Secondary | ICD-10-CM | POA: Diagnosis not present

## 2021-11-07 MED ORDER — CYCLOBENZAPRINE HCL 10 MG PO TABS: 10.0000 mg | ORAL_TABLET | Freq: Two times a day (BID) | ORAL | 0 refills | Status: AC | PRN

## 2021-11-07 MED ORDER — KETOROLAC TROMETHAMINE 30 MG/ML IJ SOLN
30.0000 mg | Freq: Once | INTRAMUSCULAR | Status: AC
  Administered 2021-11-07: 06:00:00 30 mg via INTRAMUSCULAR
  Filled 2021-11-07: qty 1

## 2021-11-07 MED ORDER — CYCLOBENZAPRINE HCL 10 MG PO TABS
10.0000 mg | ORAL_TABLET | Freq: Once | ORAL | Status: AC
  Administered 2021-11-07: 06:00:00 10 mg via ORAL
  Filled 2021-11-07: qty 1

## 2021-11-07 NOTE — ED Triage Notes (Addendum)
Pt presents with bilateral shoulder pain since October. Pt states she has limited movement in shoulders. Feels like stabbing/aching. Denies injury. Seen at urgent care in October and pt was given ibuprofen.

## 2021-11-07 NOTE — ED Notes (Signed)
Patient transported to X-ray 

## 2021-11-07 NOTE — Discharge Instructions (Signed)
You were seen today for pain in bilateral shoulders.  You have evidence of trapezius muscle hypertrophy and spasm.  Take medications as prescribed.  Continue ibuprofen.  Apply heat to see if this helps at all.  Follow-up with your primary doctor.

## 2021-11-07 NOTE — ED Provider Notes (Signed)
[facility] EMERGENCY DEPARTMENT Provider Note   CSN: [id number] Arrival date & time: 11/07/21  0454     History Chief Complaint  Patient presents with   Shoulder Pain    Brenda Avila is a 45 y.o. female.  HPI     This is a 45 year old female who presents with bilateral shoulder pain.  Patient reports stabbing pains across the back of the neck and her shoulders.  She states onset of symptoms were in October.  She moved quickly to scratch her back and had onset of symptoms.  She has noted "a knot" specifically on the left side of her back.  She has not noticed any weakness, numbness, tingling of the upper extremities.  She has been taking ibuprofen with minimal relief.  Rates her pain 8 out of 10.  History reviewed. No pertinent past medical history.  Patient Active Problem List   Diagnosis Date Noted   Incisional hernia, without obstruction or gangrene     Past Surgical History:  Procedure Laterality Date   CESAREAN SECTION     INCISIONAL HERNIA REPAIR N/A 01/27/2019   Procedure: Sherald Hess HERNIORRHAPHY WITH MESH;  Surgeon: Franky Macho, MD;  Location: [facility];  Service: General;  Laterality: N/A;   TUBAL LIGATION     WRIST SURGERY       OB History     Gravida      Para      Term      Preterm      AB      Living  3      SAB      IAB      Ectopic      Multiple      Live Births              No family history on file.  Social History   Tobacco Use   Smoking status: Every Day    Packs/day: 1.00    Types: Cigarettes   Smokeless tobacco: Never  Vaping Use   Vaping Use: Never used  Substance Use Topics   Alcohol use: No   Drug use: No    Home Medications Prior to Admission medications   Medication Sig Start Date End Date Taking? Authorizing Provider  cyclobenzaprine (FLEXERIL) 10 MG tablet Take 1 tablet (10 mg total) by mouth 2 (two) times daily as needed for muscle spasms. 11/07/21  Yes Hailey Stormer, Mayer Masker, MD  albuterol (PROVENTIL  HFA;VENTOLIN HFA) 108 (90 Base) MCG/ACT inhaler Inhale 2 puffs into the lungs every 6 (six) hours as needed for wheezing or shortness of breath.    [provider]  BIOTIN PO Take 1 tablet by mouth daily.    [provider]  citalopram (CELEXA) 20 MG tablet Take 20 mg by mouth daily.    [provider]  naproxen sodium (ALEVE) 220 MG tablet Take 440 mg by mouth 2 (two) times daily as needed (pain).    [provider]    Allergies    Hydrocodone and Tramadol  Review of Systems   Review of Systems  Constitutional:  Negative for fever.  Respiratory:  Negative for shortness of breath.   Cardiovascular:  Negative for chest pain.  Musculoskeletal:  Negative for neck stiffness.       Shoulder pain  Neurological:  Negative for weakness and numbness.  All other systems reviewed and are negative.  Physical Exam Updated Vital Signs BP 112/71 (BP Location: Left Arm)    Pulse 91  Temp 97.7 F (36.5 C) (Oral)    Resp 20    Ht 1.753 m (5\' 9" )    Wt 104.3 kg    LMP 10/31/2021 (Approximate)    SpO2 98%    BMI 33.97 kg/m   Physical Exam Vitals and nursing note reviewed.  Constitutional:      Appearance: She is well-developed.  HENT:     Head: Normocephalic and atraumatic.     Nose: Nose normal.     Mouth/Throat:     Mouth: Mucous membranes are moist.  Eyes:     Pupils: Pupils are equal, round, and reactive to light.  Cardiovascular:     Rate and Rhythm: Normal rate and regular rhythm.     Heart sounds: Normal heart sounds.  Pulmonary:     Effort: Pulmonary effort is normal. No respiratory distress.     Breath sounds: No wheezing.  Abdominal:     General: Bowel sounds are normal.     Palpations: Abdomen is soft.  Musculoskeletal:     Cervical back: Normal range of motion and neck supple. No rigidity.     Comments: Patient with tenderness to palpation and hypertrophy of the upper trapezius left greater than right with muscle spasm noted  Skin:     General: Skin is warm and dry.  Neurological:     Mental Status: She is alert and oriented to person, place, and time.     Comments: 5 out of 5 strength with deltoid, bicep, triceps, grip bilaterally  Psychiatric:        Mood and Affect: Mood normal.    ED Results / Procedures / Treatments   Labs (all labs ordered are listed, but only abnormal results are displayed) Labs Reviewed - No data to display  EKG None  Radiology DG Cervical Spine Complete  Result Date: 11/07/2021 CLINICAL DATA:  45 year old female with history of neck pain radiating into the left shoulder for the past 2 months. EXAM: CERVICAL SPINE - COMPLETE 4+ VIEW COMPARISON:  No priors. FINDINGS: Mild reversal of normal cervical lordosis centered at the level of C4, likely chronic. Alignment is otherwise anatomic. Mild multilevel degenerative disc disease, most pronounced at C4-C5 and C5-C6. Mild multilevel facet arthropathy. No acute displaced fractures. Prevertebral soft tissues are normal. IMPRESSION: 1. No acute radiographic abnormality of the cervical spine. 2. Mild multilevel degenerative disc disease and cervical spondylosis, as above. Electronically Signed   By: 48 M.D.   On: 11/07/2021 06:04    Procedures Procedures   Medications Ordered in ED Medications  cyclobenzaprine (FLEXERIL) tablet 10 mg (10 mg Oral Given 11/07/21 0541)  ketorolac (TORADOL) 30 MG/ML injection 30 mg (30 mg Intramuscular Given 11/07/21 0541)    ED Course  I have reviewed the triage vital signs and the nursing notes.  Pertinent labs & imaging results that were available during my care of the patient were reviewed by me and considered in my medical decision making (see chart for details).    MDM Rules/Calculators/A&P                           Patient presents with bilateral shoulder pain.  She is nontoxic and vital signs are reassuring.  She is neurologically intact.  Pain is really over the upper trap bilaterally.  She  does have some evidence of muscle hypertrophy and spasm there.  C-spine x-ray shows mild degenerative disc disease but otherwise no significant abnormality or fracture.  Patient  was given Flexeril and Toradol.  I did note on physical exam that it appears that her bra strap was cutting into her left trap quite significantly.  I encouraged her to get a professionally fitted bra as this may be amplifying her symptoms specifically on that side.  Will discharge with Flexeril.  Recommend ongoing NSAIDs and heat.  After history, exam, and medical workup I feel the patient has been appropriately medically screened and is safe for discharge home. Pertinent diagnoses were discussed with the patient. Patient was given return precautions.  Final Clinical Impression(s) / ED Diagnoses Final diagnoses:  Trapezius muscle spasm    Rx / DC Orders ED Discharge Orders          Ordered    cyclobenzaprine (FLEXERIL) 10 MG tablet  2 times daily PRN        11/07/21 3354             Shon Baton, MD 11/07/21 [phone]

## 2022-05-15 ENCOUNTER — Other Ambulatory Visit: Payer: Self-pay

## 2022-05-15 ENCOUNTER — Emergency Department (HOSPITAL_COMMUNITY): Payer: BC Managed Care – PPO

## 2022-05-15 ENCOUNTER — Emergency Department (HOSPITAL_COMMUNITY)
Admission: EM | Admit: 2022-05-15 | Discharge: 2022-05-15 | Disposition: A | Discharge status: Home / Self Care | Payer: BC Managed Care – PPO | Attending: Emergency Medicine | Admitting: Emergency Medicine

## 2022-05-15 DIAGNOSIS — M25561 Pain in right knee: Secondary | ICD-10-CM | POA: Diagnosis present

## 2022-05-15 MED ORDER — KETOROLAC TROMETHAMINE 15 MG/ML IJ SOLN
15.0000 mg | Freq: Once | INTRAMUSCULAR | Status: AC
  Administered 2022-05-15: 15 mg via INTRAMUSCULAR
  Filled 2022-05-15: qty 1

## 2022-05-15 NOTE — ED Provider Notes (Cosign Needed)
[facility] EMERGENCY DEPARTMENT Provider Note   CSN: [id number] Arrival date & time: 05/15/22  1552     History Chief Complaint  Patient presents with   Knee Pain    GEORGEAN SPAINHOWER is a 46 y.o. female patient who presents to the emergency department with right atraumatic knee pain that started yesterday.  Patient states that she was sitting when she had some pain laterally.  She had trouble getting up out of the chair secondary to pain.  Patient says she does have any pain when she is ambulating but when she changes position is when she experiences pain.  Again, she denies any trauma or injury.   Knee Pain      Home Medications Prior to Admission medications   Medication Sig Start Date End Date Taking? Authorizing Provider  albuterol (PROVENTIL HFA;VENTOLIN HFA) 108 (90 Base) MCG/ACT inhaler Inhale 2 puffs into the lungs every 6 (six) hours as needed for wheezing or shortness of breath.    [provider]  BIOTIN PO Take 1 tablet by mouth daily.    [provider]  citalopram (CELEXA) 20 MG tablet Take 20 mg by mouth daily.    [provider]  cyclobenzaprine (FLEXERIL) 10 MG tablet Take 1 tablet (10 mg total) by mouth 2 (two) times daily as needed for muscle spasms. 11/07/21   Horton, Mayer Masker, MD  naproxen sodium (ALEVE) 220 MG tablet Take 440 mg by mouth 2 (two) times daily as needed (pain).    [provider]      Allergies    Hydrocodone and Tramadol    Review of Systems   Review of Systems  All other systems reviewed and are negative.   Physical Exam Updated Vital Signs Ht 5\' 9"  (1.753 m)   Wt 110.2 kg   BMI 35.88 kg/m  Physical Exam Vitals and nursing note reviewed.  Constitutional:      Appearance: Normal appearance.  HENT:     Head: Normocephalic and atraumatic.  Eyes:     General:        Right eye: No discharge.        Left eye: No discharge.     Conjunctiva/sclera: Conjunctivae normal.  Pulmonary:     Effort:  Pulmonary effort is normal.  Musculoskeletal:     Comments: Joint is stable with valgus and varus testing.  Negative anterior drawer test.  There is some tenderness just lateral to the patella and to the patella.  No tenderness superiorly or inferiorly to the patella.  Patient has full flexion and extension of the knee.  Skin:    General: Skin is warm and dry.     Findings: No rash.  Neurological:     General: No focal deficit present.     Mental Status: She is alert.  Psychiatric:        Mood and Affect: Mood normal.        Behavior: Behavior normal.     ED Results / Procedures / Treatments   Labs (all labs ordered are listed, but only abnormal results are displayed) Labs Reviewed - No data to display  EKG None  Radiology DG Knee Complete 4 Views Right  Result Date: 05/15/2022 CLINICAL DATA:  Right knee pain starting last night. EXAM: RIGHT KNEE - COMPLETE 4+ VIEW COMPARISON:  None Available. FINDINGS: Normal bone mineralization. Joint spaces are preserved. No acute fracture is seen. No dislocation. No joint effusion. Tiny 3 mm calcific density anterior to the quadriceps tendon  insertion on lateral view. IMPRESSION: No significant osteoarthritis.  No acute fracture. Electronically Signed   By: Neita Garnet M.D.   On: 05/15/2022 16:36    Procedures Procedures    Medications Ordered in ED Medications  ketorolac (TORADOL) 15 MG/ML injection 15 mg (15 mg Intramuscular Given 05/15/22 1634)    ED Course/ Medical Decision Making/ A&P                           Medical Decision Making TIMEKA GOETTE is a 46 y.o. female patient who presents to the emergency department today for further evaluation of right-sided knee pain.  Have low suspicion for any acute ligamentous injury at this time given findings on physical exam.  Patient is having tenderness over the patella and just lateral.  This could be osteoarthritis, bursitis, patellar tendinitis, meniscal injury although unlikely given  there was no trauma or significant mechanism. Will evaluated further with knee xray.    Amount and/or Complexity of Data Reviewed Radiology: ordered and independent interpretation performed.    Details: I personally ordered and interpreted a x-ray of the right knee which did not show any evidence of right knee injury.  No fracture or dislocation.  I do agree with the radiologist interpretation.  Risk Prescription drug management. Parenteral controlled substances. Risk Details: Patient is feeling better after Toradol. Will give her a knee sleeve for support.Going to have her follow-up with orthopedics for further evaluation.  Patient takes Celebrex and Tylenol as needed for cervical radiculopathy pain.  I am going to have her take that on a scheduled basis for her knee pain as well.  I will have her follow-up with orthopedics for further evaluation.  Strict return precautions were discussed.  She is safer discharge at this time.   Final Clinical Impression(s) / ED Diagnoses Final diagnoses:  Acute pain of right knee    Rx / DC Orders ED Discharge Orders     None         Teressa Lower, [place] 05/15/22 1805

## 2022-05-15 NOTE — Discharge Instructions (Signed)
Please take Tylenol and Celebrex regularly for your knee pain.  Please use knee sleeve for comfort.  I would like you to follow-up with orthopedics for further evaluation.  Please return to the emergency department for any worsening symptoms you might have.

## 2022-05-15 NOTE — ED Triage Notes (Signed)
Patient presents to ED  with c/o right pain. Pain started last night and progressively getting worse. Denies any injury.

## 2022-08-25 ENCOUNTER — Encounter (HOSPITAL_COMMUNITY): Payer: Self-pay | Admitting: Emergency Medicine

## 2022-08-25 ENCOUNTER — Emergency Department (HOSPITAL_COMMUNITY)
Admission: EM | Admit: 2022-08-25 | Discharge: 2022-08-25 | Discharge status: Against Medical Advice | Payer: BC Managed Care – PPO | Attending: Emergency Medicine | Admitting: Emergency Medicine

## 2022-08-25 ENCOUNTER — Other Ambulatory Visit: Payer: Self-pay

## 2022-08-25 DIAGNOSIS — R1030 Lower abdominal pain, unspecified: Secondary | ICD-10-CM | POA: Diagnosis present

## 2022-08-25 DIAGNOSIS — Z5321 Procedure and treatment not carried out due to patient leaving prior to being seen by health care provider: Secondary | ICD-10-CM | POA: Diagnosis not present

## 2022-08-25 LAB — CBC
HCT: 40.2 % (ref 36.0–46.0)
Hemoglobin: 13.4 g/dL (ref 12.0–15.0)
MCH: 30.7 pg (ref 26.0–34.0)
MCHC: 33.3 g/dL (ref 30.0–36.0)
MCV: 92 fL (ref 80.0–100.0)
Platelets: 250 K/uL (ref 150–400)
RBC: 4.37 MIL/uL (ref 3.87–5.11)
RDW: 14.9 % (ref 11.5–15.5)
WBC: 5.8 K/uL (ref 4.0–10.5)
nRBC: 0 % (ref 0.0–0.2)

## 2022-08-25 LAB — COMPREHENSIVE METABOLIC PANEL
ALT: 28 U/L (ref 0–44)
AST: 23 U/L (ref 15–41)
Albumin: 3.4 g/dL — ABNORMAL LOW (ref 3.5–5.0)
Alkaline Phosphatase: 51 U/L (ref 38–126)
Anion gap: 9 (ref 5–15)
BUN: 8 mg/dL (ref 6–20)
CO2: 24 mmol/L (ref 22–32)
Calcium: 8.8 mg/dL — ABNORMAL LOW (ref 8.9–10.3)
Chloride: 105 mmol/L (ref 98–111)
Creatinine, Ser: 1.01 mg/dL — ABNORMAL HIGH (ref 0.44–1.00)
GFR, Estimated: >60 mL/min (ref >60)
Glucose, Bld: 73 mg/dL (ref 70–99)
Potassium: 3.9 mmol/L (ref 3.5–5.1)
Sodium: 138 mmol/L (ref 135–145)
Total Bilirubin: 0.1 mg/dL — ABNORMAL LOW (ref 0.3–1.2)
Total Protein: 6.8 g/dL (ref 6.5–8.1)

## 2022-08-25 LAB — LIPASE, BLOOD: Lipase: 35 U/L (ref 11–51)

## 2022-08-25 NOTE — ED Notes (Signed)
Pt told nurse she has decided to go home and f/u with PCP tomorrow, says he pain is better at this time. Dr Stark Jock made aware.

## 2022-08-25 NOTE — ED Triage Notes (Signed)
Pt with c/o lower abdominal pain x 2 nights and states she "dropped a clot" from her vagina.

## 2022-10-09 ENCOUNTER — Other Ambulatory Visit: Payer: Self-pay | Admitting: Internal Medicine

## 2022-10-09 DIAGNOSIS — Z1231 Encounter for screening mammogram for malignant neoplasm of breast: Secondary | ICD-10-CM

## 2022-10-23 IMAGING — DX DG KNEE COMPLETE 4+V*R*
4 series · 4 of 4 positions shown · non-contrast
Comparison: None Available.

CLINICAL DATA: Right knee pain starting last night.

EXAM:
RIGHT KNEE - COMPLETE 4+ VIEW

[knee ap]
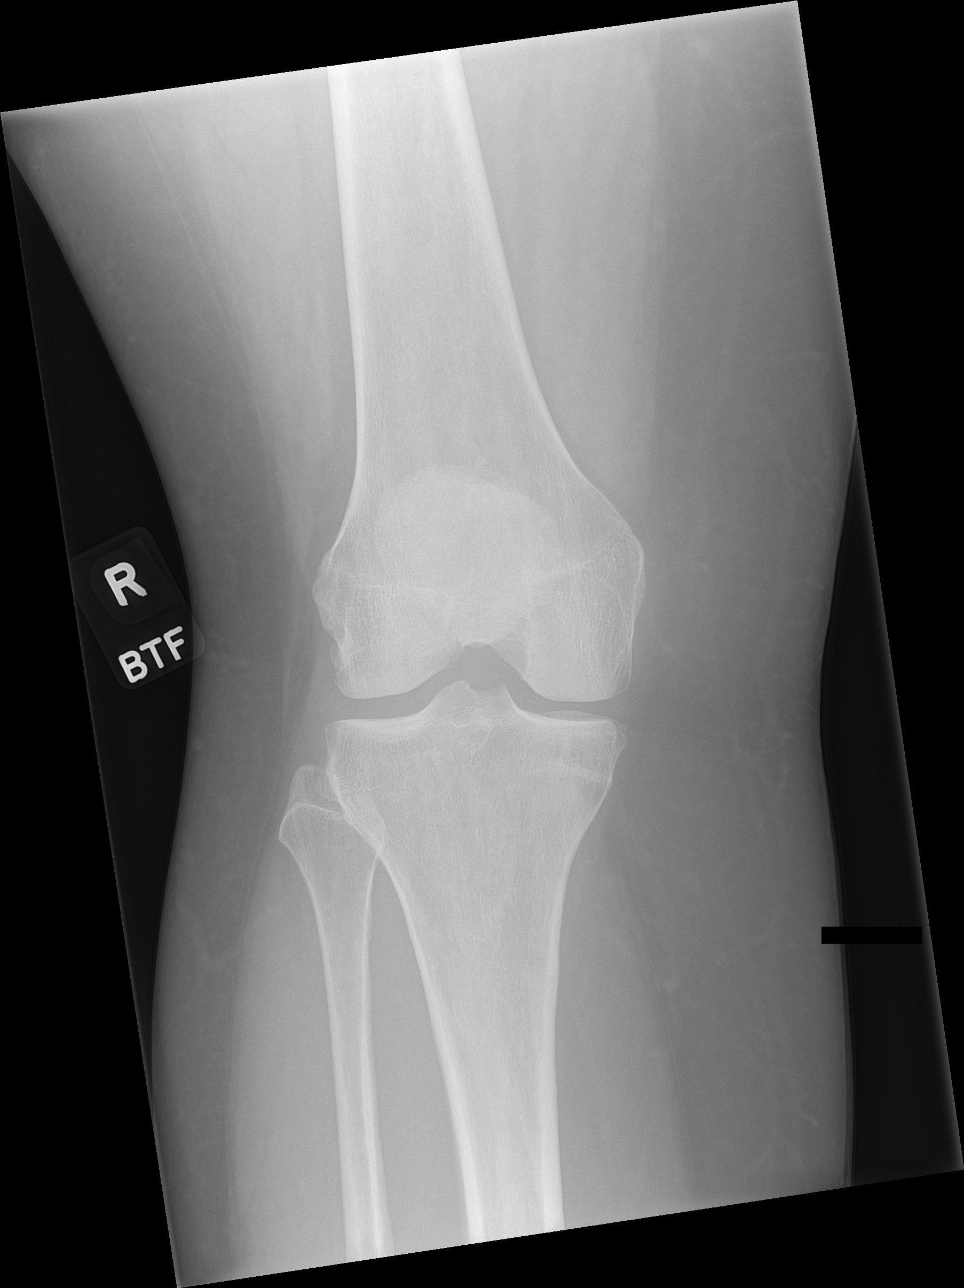

[knee obl (1 of 2)]
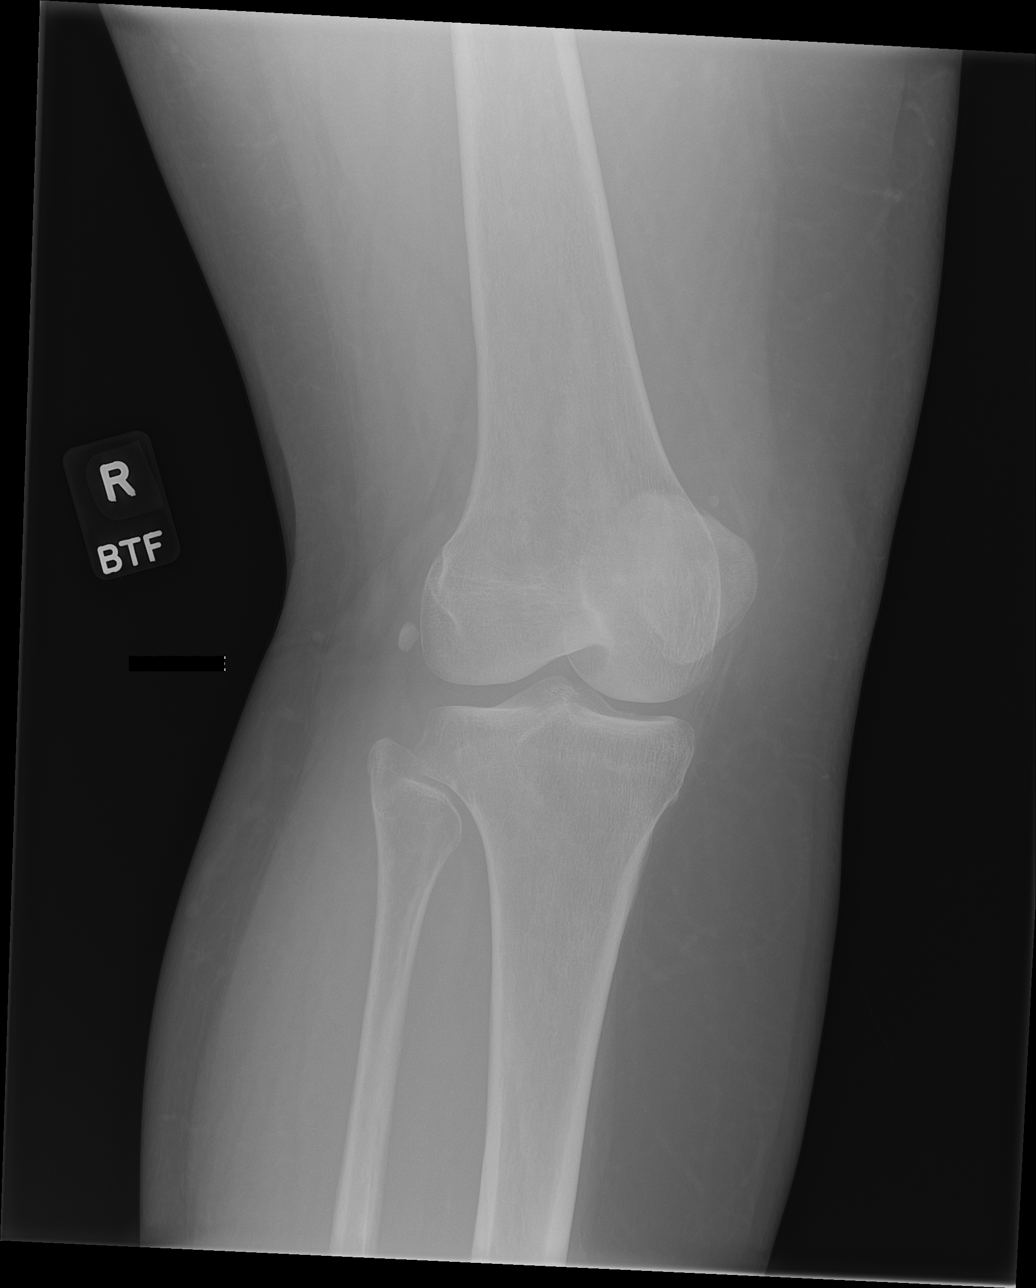

[knee obl (2 of 2)]
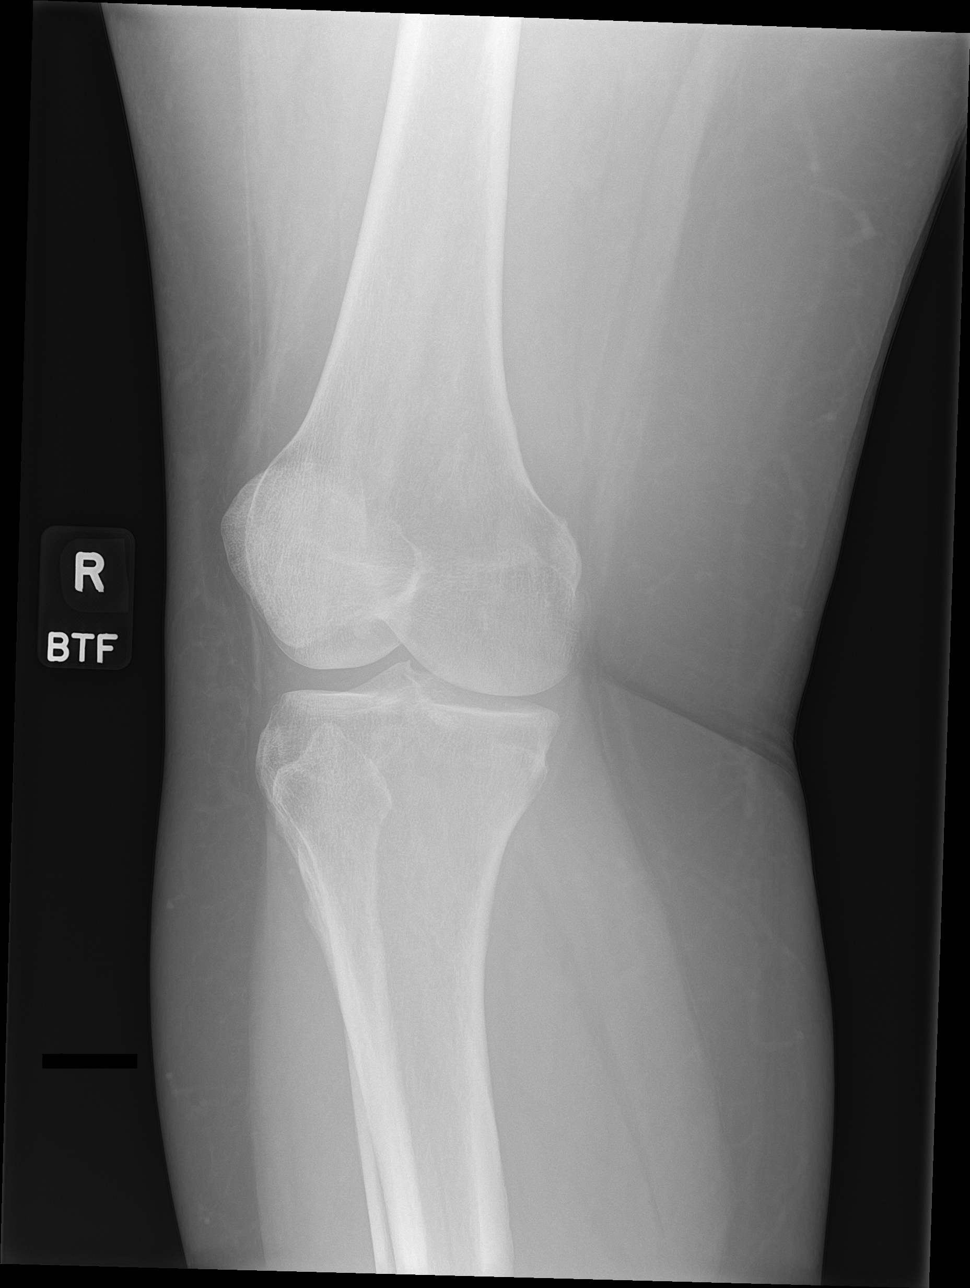

[knee lat]
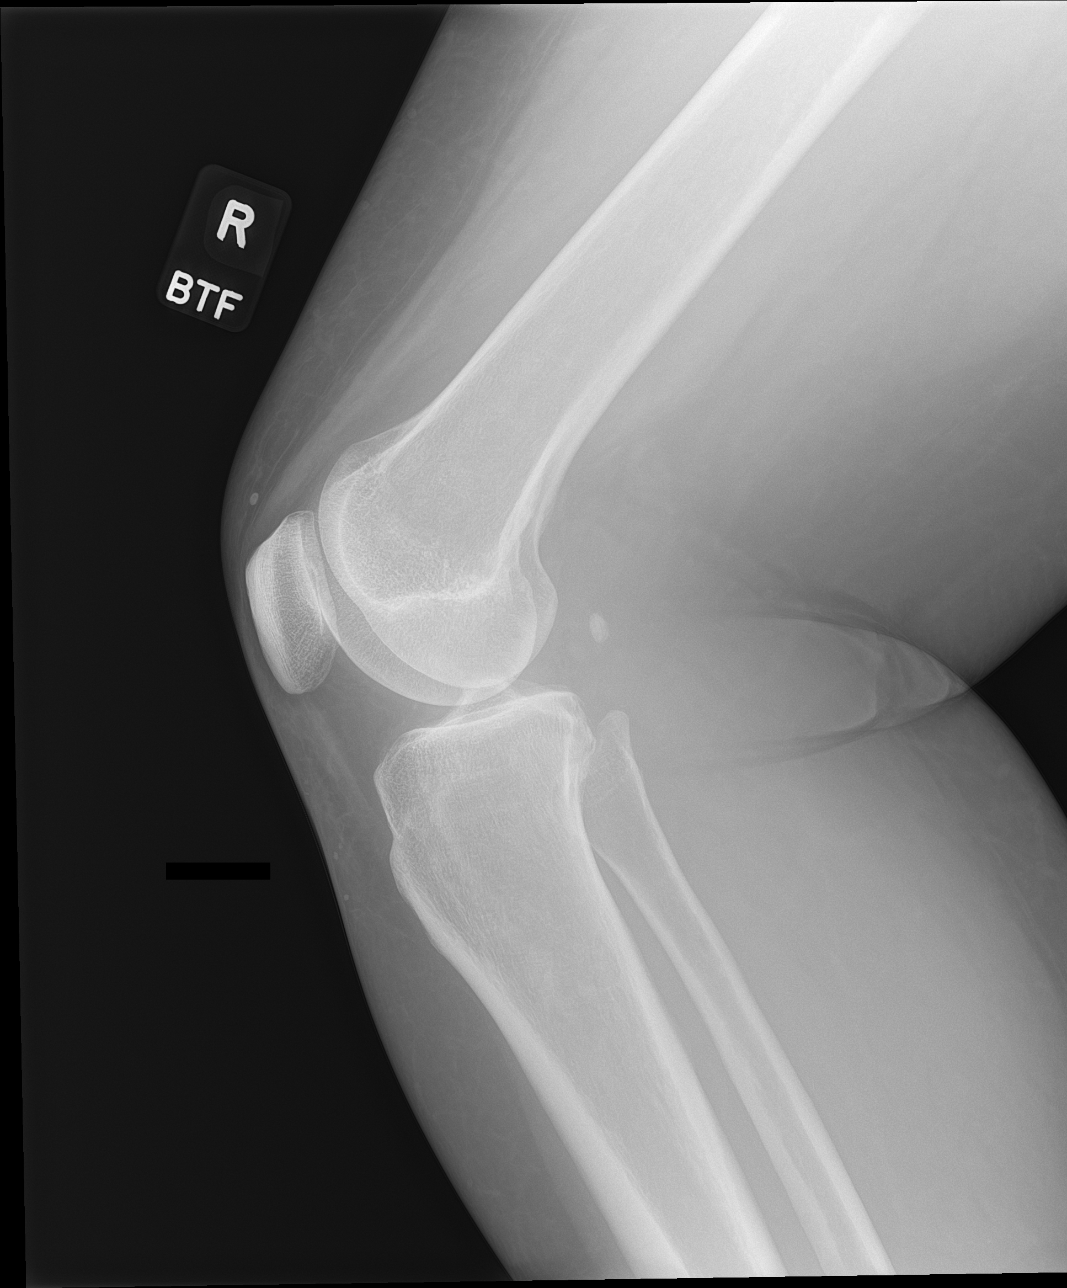

[4 of 4 positions shown; findings below may reference images not displayed]

FINDINGS: Normal bone mineralization. Joint spaces are preserved. No acute
fracture is seen. No dislocation. No joint effusion. Tiny 3 mm
calcific density anterior to the quadriceps tendon insertion on
lateral view.
IMPRESSION: No significant osteoarthritis.  No acute fracture.

## 2022-11-20 ENCOUNTER — Ambulatory Visit
Admission: RE | Admit: 2022-11-20 | Discharge: 2022-11-20 | Disposition: A | Discharge status: Home / Self Care | Payer: BC Managed Care – PPO | Source: Ambulatory Visit | Attending: Internal Medicine | Admitting: Internal Medicine

## 2022-11-20 DIAGNOSIS — Z1231 Encounter for screening mammogram for malignant neoplasm of breast: Secondary | ICD-10-CM

## 2023-02-06 ENCOUNTER — Other Ambulatory Visit: Payer: Self-pay

## 2023-02-06 ENCOUNTER — Emergency Department (HOSPITAL_COMMUNITY): Payer: BC Managed Care – PPO

## 2023-02-06 ENCOUNTER — Encounter (HOSPITAL_COMMUNITY): Payer: Self-pay

## 2023-02-06 DIAGNOSIS — M25571 Pain in right ankle and joints of right foot: Secondary | ICD-10-CM | POA: Diagnosis present

## 2023-02-06 NOTE — ED Triage Notes (Signed)
Complaining of pain in the right foot, has a knot on the blade side of the foot.

## 2023-02-07 ENCOUNTER — Emergency Department (HOSPITAL_COMMUNITY)
Admission: EM | Admit: 2023-02-07 | Discharge: 2023-02-07 | Disposition: A | Discharge status: Home / Self Care | Payer: BC Managed Care – PPO | Attending: Emergency Medicine | Admitting: Emergency Medicine

## 2023-02-07 DIAGNOSIS — M79671 Pain in right foot: Secondary | ICD-10-CM

## 2023-02-07 NOTE — ED Provider Notes (Signed)
[facility] Provider Note   CSN: [id number] Arrival date & time: 02/06/23  2247     History  Chief Complaint  Patient presents with   Foot Pain    Brenda Avila is a 47 y.o. female.  Patient presents to the emergency department for evaluation of right foot pain.  Patient reports that she first noticed the pain when she was driving home from work.  Pain is on the top lateral part of her foot.  She reports pain with using the foot, such as stepping on the brake when driving.  She is noticing pain with ambulation now.  Denies any direct injury.  She has noticed a knot on the foot where the pain is.       Home Medications Prior to Admission medications   Medication Sig Start Date End Date Taking? Authorizing Provider  albuterol (PROVENTIL HFA;VENTOLIN HFA) 108 (90 Base) MCG/ACT inhaler Inhale 2 puffs into the lungs every 6 (six) hours as needed for wheezing or shortness of breath.    [provider]  BIOTIN PO Take 1 tablet by mouth daily.    [provider]  citalopram (CELEXA) 20 MG tablet Take 20 mg by mouth daily.    [provider]  cyclobenzaprine (FLEXERIL) 10 MG tablet Take 1 tablet (10 mg total) by mouth 2 (two) times daily as needed for muscle spasms. 11/07/21   Horton, Barbette Hair, MD  naproxen sodium (ALEVE) 220 MG tablet Take 440 mg by mouth 2 (two) times daily as needed (pain).    [provider]      Allergies    Hydrocodone and Tramadol    Review of Systems   Review of Systems  Physical Exam Updated Vital Signs BP 135/74 (BP Location: Right Arm)   Pulse 69   Temp 98.2 F (36.8 C) (Oral)   Resp 20   Ht 5\' 9"  (1.753 m)   Wt 104.3 kg   LMP 01/20/2023   SpO2 100%   BMI 33.97 kg/m  Physical Exam Vitals and nursing note reviewed.  Cardiovascular:     Pulses:          Dorsalis pedis pulses are 2+ on the right side and 2+ on the left side.  Musculoskeletal:     Left foot:  Normal range of motion. Tenderness and bony tenderness present. No swelling, deformity, foot drop or laceration.       Legs:  Skin:    General: Skin is warm and dry.     Findings: No abscess, bruising, ecchymosis, erythema, rash or wound.  Neurological:     Sensory: Sensation is intact.     Motor: Motor function is intact.     Coordination: Coordination is intact.     ED Results / Procedures / Treatments   Labs (all labs ordered are listed, but only abnormal results are displayed) Labs Reviewed - No data to display  EKG None  Radiology DG Foot Complete Right  Result Date: 02/06/2023 CLINICAL DATA:  Pain aggravated by standing. Not to the lateral dorsal aspect of the right. EXAM: RIGHT FOOT COMPLETE - 3+ VIEW COMPARISON:  None Available. FINDINGS: There is no evidence of fracture or dislocation. Plantar calcaneal spurring. Soft tissues are unremarkable. IMPRESSION: 1. No acute findings. 2. Plantar calcaneal spurring. Electronically Signed   By: Keane Police D.O.   On: 02/06/2023 23:58    Procedures Procedures    Medications Ordered in ED Medications - No data to  display  ED Course/ Medical Decision Making/ A&P                             Medical Decision Making Amount and/or Complexity of Data Reviewed Radiology: ordered.   Patient complains of pain and tenderness of the dorsal lateral aspect of the right foot.  Examination reveals good pulses, normal capillary refill, normal overlying skin.  No sign of infection.  There is a tender nodule present on the dorsal lateral aspect of the foot, where she is experiencing the pain.  I do not see a corresponding lesion on the x-ray.  This nodule was not present on the other foot.  Unclear what the significance is at this time.  Patient does not appear to have a serious life-threatening medical condition causing her pain.  She will be placed in a cam walker for comfort, anti-inflammatory medications, rest the area.  Referral to  podiatry.        Final Clinical Impression(s) / ED Diagnoses Final diagnoses:  Foot pain, right    Rx / DC Orders ED Discharge Orders     None         Kelsa Jaworowski, Gwenyth Allegra, MD 02/07/23 [phone]

## 2023-02-07 NOTE — Discharge Instructions (Signed)
Rest and elevate the foot. Wear the boot when walking. Take NSAIDS (ibuprofen, aleve) as needed for pain.

## 2024-10-02 ENCOUNTER — Other Ambulatory Visit: Payer: Self-pay

## 2024-10-02 ENCOUNTER — Emergency Department (HOSPITAL_COMMUNITY)

## 2024-10-02 ENCOUNTER — Emergency Department (HOSPITAL_COMMUNITY)
Admission: EM | Admit: 2024-10-02 | Discharge: 2024-10-02 | Disposition: A | Discharge status: Home / Self Care | Attending: Emergency Medicine | Admitting: Emergency Medicine

## 2024-10-02 DIAGNOSIS — M25561 Pain in right knee: Secondary | ICD-10-CM | POA: Insufficient documentation

## 2024-10-02 DIAGNOSIS — F17210 Nicotine dependence, cigarettes, uncomplicated: Secondary | ICD-10-CM | POA: Insufficient documentation

## 2024-10-02 MED ORDER — NAPROXEN 250 MG PO TABS
500.0000 mg | ORAL_TABLET | Freq: Once | ORAL | Status: AC
  Administered 2024-10-02: 500 mg via ORAL
  Filled 2024-10-02: qty 2

## 2024-10-02 MED ORDER — OXYCODONE-ACETAMINOPHEN 5-325 MG PO TABS: 1.0000 | ORAL_TABLET | Freq: Once | ORAL | Status: DC

## 2024-10-02 MED ORDER — KETOROLAC TROMETHAMINE 15 MG/ML IJ SOLN
15.0000 mg | Freq: Once | INTRAMUSCULAR | Status: DC
  Filled 2024-10-02: qty 1

## 2024-10-02 NOTE — ED Triage Notes (Signed)
 Pt states last Saturday twist right leg from knee to ankle. States has had swelling in calf and pain to walk x 7 days. Has been using ice and aleve with minimal relief. Pt ambulatory with limp to triage. Declined wheelchair.

## 2024-10-02 NOTE — ED Notes (Signed)
 Pt is refusing Percocet due to having to drive and endorses she would like a medication similar to aleve. MD Scheving made aware.

## 2024-10-02 NOTE — ED Notes (Signed)
 Pt endorses that she does not like shots and would like the MD to order her a pain medication that is by mouth. MD Scheving aware.

## 2024-10-02 NOTE — ED Provider Notes (Signed)
 [facility] EMERGENCY DEPARTMENT AT [facility] Provider Note  CSN: [id number] Arrival date & time: 10/02/24 1844  Chief Complaint(s) Leg Pain  HPI Brenda Avila is a 48 y.o. female presenting to the emergency department with knee pain.  Patient reports that she was trying to avoid a field rat when her knee twisted and she developed knee pain.  Reports that she has been ambulating although with discomfort.  Denies any ankle pain.  Reports pain radiates to the calf.  No recent travel or surgeries.  No rash   Past Medical History No past medical history on file. Patient Active Problem List   Diagnosis Date Noted   Incisional hernia, without obstruction or gangrene    Home Medication(s) Prior to Admission medications   Medication Sig Start Date End Date Taking? Authorizing Provider  albuterol  (PROVENTIL  HFA;VENTOLIN  HFA) 108 (90 Base) MCG/ACT inhaler Inhale 2 puffs into the lungs every 6 (six) hours as needed for wheezing or shortness of breath.    [provider]  BIOTIN PO Take 1 tablet by mouth daily.    [provider]  citalopram (CELEXA) 20 MG tablet Take 20 mg by mouth daily.    [provider]  cyclobenzaprine  (FLEXERIL ) 10 MG tablet Take 1 tablet (10 mg total) by mouth 2 (two) times daily as needed for muscle spasms. 11/07/21   Horton, Charmaine FALCON, MD  naproxen sodium (ALEVE) 220 MG tablet Take 440 mg by mouth 2 (two) times daily as needed (pain).    [provider]                                                                                                                                    Past Surgical History Past Surgical History:  Procedure Laterality Date   CESAREAN SECTION     INCISIONAL HERNIA REPAIR N/A 01/27/2019   Procedure: SHIRLENE HERNIORRHAPHY WITH MESH;  Surgeon: Mavis Anes, MD;  Location: [facility];  Service: General;  Laterality: N/A;   TUBAL LIGATION     WRIST SURGERY     Family History No family history  on file.  Social History Social History   Tobacco Use   Smoking status: Every Day    Current packs/day: 1.00    Types: Cigarettes   Smokeless tobacco: Never  Vaping Use   Vaping status: Never Used  Substance Use Topics   Alcohol use: No   Drug use: No   Allergies Hydrocodone  and Tramadol   Review of Systems Review of Systems  All other systems reviewed and are negative.   Physical Exam Vital Signs  I have reviewed the triage vital signs BP (!) 156/92   Pulse 90   Temp 98.2 F (36.8 C) (Oral)   Resp 18   Ht 5' 9 (1.753 m)   Wt 106.6 kg   LMP 09/15/2024 (Exact Date)   SpO2 99%   BMI 34.70 kg/m  Physical Exam  Vitals and nursing note reviewed.  Constitutional:      Appearance: Normal appearance.  HENT:     Head: Normocephalic and atraumatic.     Mouth/Throat:     Mouth: Mucous membranes are moist.  Eyes:     Conjunctiva/sclera: Conjunctivae normal.  Cardiovascular:     Rate and Rhythm: Normal rate.  Pulmonary:     Effort: Pulmonary effort is normal. No respiratory distress.  Abdominal:     General: Abdomen is flat.  Musculoskeletal:        General: No deformity.     Comments: Able to elevate right leg antigravity.  Mild tenderness over the right lateral knee.  No obvious effusion.  No warmth or erythema.  No lower extremity edema  Skin:    General: Skin is warm and dry.     Capillary Refill: Capillary refill takes less than 2 seconds.  Neurological:     General: No focal deficit present.     Mental Status: She is alert. Mental status is at baseline.  Psychiatric:        Mood and Affect: Mood normal.        Behavior: Behavior normal.     ED Results and Treatments Labs (all labs ordered are listed, but only abnormal results are displayed) Labs Reviewed - No data to display                                                                                                                        Radiology DG Knee Complete 4 Views Right Result Date:  10/02/2024 EXAM: 4 OR MORE VIEW(S) XRAY OF THE RIGHT KNEE 10/02/2024 07:56:18 PM COMPARISON: None available. CLINICAL HISTORY: knee pain knee pain FINDINGS: BONES AND JOINTS: No acute fracture. No focal osseous lesion. No joint dislocation. No significant joint effusion. No significant degenerative changes. SOFT TISSUES: The soft tissues are unremarkable. IMPRESSION: 1. No significant abnormality. Electronically signed by: Greig Pique MD 10/02/2024 08:03 PM EST RP Workstation: [id number]    Pertinent labs & imaging results that were available during my care of the patient were reviewed by me and considered in my medical decision making (see MDM for details).  Medications Ordered in ED Medications  ketorolac  (TORADOL ) 15 MG/ML injection 15 mg (15 mg Intramuscular Patient Refused/Not Given 10/02/24 2035)  oxyCODONE -acetaminophen  (PERCOCET/ROXICET) 5-325 MG per tablet 1 tablet (has no administration in time range)  Procedures Procedures  (including critical care time)  Medical Decision Making / ED Course   MDM:  48 year old presenting to the emergency department with knee pain after trying to avoid an animal.  Seems most consistent with sprain of the knee.  Quadriceps tendon seems intact.  Patient has been ambulatory.  No objective signs of erythema, swelling to suggest other process such as DVT and injury occurred after twisting motion.  Will obtain x-ray.  Anticipate likely discharge with orthopedic follow-up.  Clinical Course as of 10/02/24 2053  Sat Oct 02, 2024  2050 X-ray negative.  Discussed results with the patient.  Low concern for other dangerous process such as DVT.  Recommended follow-up with orthopedic surgery.  Discussed return precautions. Will discharge patient to home. All questions answered. Patient comfortable with plan of discharge. Return  precautions discussed with patient and specified on the after visit summary.  [WS]    Clinical Course User Index [WS] Francesca Elsie CROME, MD     Imaging Studies ordered: I ordered imaging studies including XR knee On my interpretation imaging demonstrates no acute process I independently visualized and interpreted imaging. I agree with the radiologist interpretation   Medicines ordered and prescription drug management: Meds ordered this encounter  Medications   ketorolac  (TORADOL ) 15 MG/ML injection 15 mg   oxyCODONE -acetaminophen  (PERCOCET/ROXICET) 5-325 MG per tablet 1 tablet    Refill:  0    -I have reviewed the patients home medicines and have made adjustments as needed    Social Determinants of Health:  Diagnosis or treatment significantly limited by social determinants of health: obesity   Reevaluation: After the interventions noted above, I reevaluated the patient and found that their symptoms have improved  Co morbidities that complicate the patient evaluation No past medical history on file.    Dispostion: Disposition decision including need for hospitalization was considered, and patient discharged from emergency department.    Final Clinical Impression(s) / ED Diagnoses Final diagnoses:  Acute pain of right knee     This chart was dictated using voice recognition software.  Despite best efforts to proofread,  errors can occur which can change the documentation meaning.    Francesca Elsie CROME, MD 10/02/24 2053

## 2024-10-02 NOTE — Discharge Instructions (Addendum)
 We evaluated you for your knee pain.  Your testing in the emergency department including your x-ray and your physical exam was reassuring.  Please follow-up with Dr. Onesimo, an orthopedic surgeon.  You may need further testing of your knee.  Please take Tylenol  (acetaminophen ) and Motrin  (ibuprofen ) for your symptoms at home.  You can take 1000 mg of Tylenol  every 6 hours and 600 mg of Motrin  every 6 hours as needed for your symptoms.  You can take these medicines together as needed, either at the same time, or alternating every 3 hours.  If you develop any new or worsening symptoms such as swelling to your leg, increasing pain, redness to your leg, fevers or chills, chest pain, or shortness of breath, please return to the emergency department.
